# Patient Record
Sex: Male | Born: 1965 | Race: White | Hispanic: No | Marital: Married | Smoking: Never smoker
Health system: Southern US, Community
[De-identification: ages and names within clinical notes are randomized; demographics above are authoritative.]

## PROBLEM LIST (undated history)

## (undated) DIAGNOSIS — F329 Major depressive disorder, single episode, unspecified: Secondary | ICD-10-CM

## (undated) DIAGNOSIS — E7800 Pure hypercholesterolemia, unspecified: Secondary | ICD-10-CM

## (undated) DIAGNOSIS — F32A Depression, unspecified: Secondary | ICD-10-CM

## (undated) DIAGNOSIS — F419 Anxiety disorder, unspecified: Secondary | ICD-10-CM

## (undated) DIAGNOSIS — R51 Headache: Secondary | ICD-10-CM

## (undated) DIAGNOSIS — S4990XA Unspecified injury of shoulder and upper arm, unspecified arm, initial encounter: Secondary | ICD-10-CM

## (undated) HISTORY — DX: Depression, unspecified: F32.A

## (undated) HISTORY — DX: Headache: R51

## (undated) HISTORY — DX: Pure hypercholesterolemia, unspecified: E78.00

## (undated) HISTORY — PX: SHOULDER SURGERY: SHX246

## (undated) HISTORY — PX: OTHER SURGICAL HISTORY: SHX169

## (undated) HISTORY — DX: Major depressive disorder, single episode, unspecified: F32.9

## (undated) HISTORY — DX: Anxiety disorder, unspecified: F41.9

## (undated) HISTORY — DX: Unspecified injury of shoulder and upper arm, unspecified arm, initial encounter: S49.90XA

---

## 2014-03-17 ENCOUNTER — Ambulatory Visit (INDEPENDENT_AMBULATORY_CARE_PROVIDER_SITE_OTHER): Payer: Medicare HMO | Admitting: Psychiatry

## 2014-03-17 ENCOUNTER — Encounter (INDEPENDENT_AMBULATORY_CARE_PROVIDER_SITE_OTHER): Payer: Self-pay

## 2014-03-17 ENCOUNTER — Encounter (HOSPITAL_COMMUNITY): Payer: Self-pay | Admitting: Psychiatry

## 2014-03-17 VITALS — BP 145/90 | HR 90 | Ht 72.0 in | Wt 206.0 lb

## 2014-03-17 DIAGNOSIS — F331 Major depressive disorder, recurrent, moderate: Secondary | ICD-10-CM

## 2014-03-17 MED ORDER — BUPROPION HCL ER (SR) 100 MG PO TB12: ORAL_TABLET | ORAL | Status: DC

## 2014-03-17 NOTE — Progress Notes (Signed)
Patient ID: James Conley, male   DOB: 12-05-1965, 48 y.o.   MRN: [id number]  [facility]   James Conley [id number] 47 y.o.  03/17/2014 10:14 AM  Chief Complaint:  depression  History of Present Illness:   Patient Presents for Initial Evaluation with symptoms of depression. Patient is currently married Caucasian. [place] to [place].  Endorses feeling down and depressed for the last few weeks but says that depression has been there for many years including 15-20 years. His dad died 16 years ago but even before that he was feeling depressed  Associated symptoms are disturbed sleep energy appetite feeling out of focus fatigue difficulty focusing to up at times even at work. Does not endorse hopelessness to the point of suicidal or homicidal thoughts. He feels withdrawn not interested in activities that he used to enjoy before there's nothing specific but states  the relationship may somewhat distant and also financial issues. He feels some guilt related to his work hours that he spends less time with family. Younger daughter also has been giving some difficult time because of her moods.  Modifying factors are medication does help he has been on Lexapro in past that has helped some but he still would feel  Tired.   Duration of depression more than 50-20 years timing possibly related to father's death but there is no specific trigger or context to timing. Does not endorse psychotic symptoms currently or in past. Also no manic symptoms currently or in the past does not endorse any anxiety to the point of having panic attacks  Also the states his job is 40 hours mostly from home but stressful but he still able to function but is feeling somewhat difficult to focus  in this project recently.  Depressive symptoms:  See above   PTSD Symptoms:  Denies any trauma   Past Psychiatric  History/Hospitalization(s) denies  Hospitalization for psychiatric illness: No History of Electroconvulsive Shock Therapy: No Prior Suicide Attempts: No  Medical History; Past Medical History  Diagnosis Date  . Shoulder injury     Allergies: No Known Allergies  Medications: Outpatient Encounter Prescriptions as of 03/17/2014  Medication Sig  . buPROPion (WELLBUTRIN SR) 100 MG 12 hr tablet Take one tablet for first 5 days then start 2 tablets.     Substance Abuse History:   Family History; No family history on file. Mom had anxiety and depression.   Biopsychosocial History:  Grew up with his parents he had a younger brother said that he would get bored in school did finish high school and is currently working as Data processing managerD manager. There's no legal issues history married and has 3 kids 2 with the current wife age 236 and [id number]    Labs:  No results found for this or any previous visit (from the past 2160 hour(s)).     Musculoskeletal: Strength & Muscle Tone: within normal limits Gait & Station: normal Patient leans: N/A  Mental Status Examination;   Psychiatric Specialty Exam: Physical Exam  Constitutional: He appears well-developed and well-nourished.  HENT:  Head: Normocephalic and atraumatic.  Neck: Neck supple.    Review of Systems  Constitutional: Positive for malaise/fatigue. Negative for fever, chills, weight loss and diaphoresis.  HENT: Negative.   Eyes: Negative.   Respiratory: Negative.   Cardiovascular: Negative.   Gastrointestinal: Negative.   Genitourinary: Negative.   Musculoskeletal: Positive for myalgias.  Skin: Negative.   Neurological: Negative.  Negative for weakness.  Endo/Heme/Allergies: Negative.  Psychiatric/Behavioral: Positive for depression. The patient is nervous/anxious.     Blood pressure 145/90, pulse 90, height 6' (1.829 m), weight 206 lb (93.441 kg).Body mass index is 27.93 kg/(m^2).  General Appearance: Casual  Eye Contact::   Fair  Speech:  Normal Rate  Volume:  Decreased  Mood:  Dysphoric  Affect:  Congruent  Thought Process:  Linear  Orientation:  Full (Time, Place, and Person)  Thought Content:  Rumination  Suicidal Thoughts:  No  Homicidal Thoughts:  No  Memory:  Immediate;   Fair Recent;   Fair  Judgement:  Fair  Insight:  Fair  Psychomotor Activity:  Normal  Concentration:  Fair  Recall:  Fair  Akathisia:  Negative  Handed:  Right  AIMS (if indicated):     Assets:  Communication Skills Desire for Improvement Financial Resources/Insurance Housing  Sleep:        Assessment: 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 Shoulder injury     Axis IV: Psychosocial financial   Treatment Plan and Summary: Has been on Lexapro in the past with no reasonable improvement. We'll start Wellbutrin SR 100 mg increase it to 200 mg in the next one week. Informed patient that it may take 2 or 3 weeks to build up in the system. He may need therapy as of now is busy with his work  Pertinent Labs and Relevant Prior Notes reviewed. Medication Side effects, benefits and risks reviewed/discussed with Patient. Time given for patient to respond and asks questions regarding the Diagnosis and Medications. Safety concerns and to report to ER if suicidal or call 911. Relevant Medications refilled or called in to pharmacy. Discussed weight maintenance and Sleep Hygiene. Follow up with Primary care provider in regards to Medical conditions. Recommend compliance with medications and follow up office appointments. Discussed to avail opportunity to consider or/and continue Individual therapy with Counselor. Greater than 50% of time was spend in counseling and coordination of care with the patient.  Schedule for Follow up visit in to 4 weeks  or call in earlier as necessary.   Thresa RossAKHTAR, Chalmers Iddings, MD 03/17/2014

## 2014-04-17 ENCOUNTER — Encounter (INDEPENDENT_AMBULATORY_CARE_PROVIDER_SITE_OTHER): Payer: Self-pay

## 2014-04-17 ENCOUNTER — Ambulatory Visit (INDEPENDENT_AMBULATORY_CARE_PROVIDER_SITE_OTHER): Payer: Medicare HMO | Admitting: Psychiatry

## 2014-04-17 ENCOUNTER — Encounter (HOSPITAL_COMMUNITY): Payer: Self-pay | Admitting: Psychiatry

## 2014-04-17 VITALS — Wt 206.0 lb

## 2014-04-17 DIAGNOSIS — F331 Major depressive disorder, recurrent, moderate: Secondary | ICD-10-CM

## 2014-04-17 MED ORDER — BUPROPION HCL ER (SR) 150 MG PO TB12: ORAL_TABLET | ORAL | Status: DC

## 2014-04-17 NOTE — Progress Notes (Signed)
Patient ID: James Conley, male   DOB: October 21, 1965, 48 y.o.   MRN: [id number]  [facility]  James Conley [id number] 48 y.o.  04/17/2014 2:59 PM  Chief Complaint:  depression  History of Present Illness:   Patient Presents for Initial Evaluation with symptoms of depression. Patient is currently married Caucasian. [place].  Endorses feeling down and depressed for the last few weeks but says that depression has been there for many years including 15-20 years. His dad died 16 years ago but even before that he was feeling depressed Severity of depression. 6/10. 10 being v happy.   last visit we started him on Wellbutrin which is now 200 mg. He's not crying he is having less emotional spells. Is not feeling agitated. But he feels maybe medication is not doing enough. He still works extra hours and feels guilty of not spending too much time at the kids but he is working on it. Discussed the [facility] for therapy.  He has been on Lexapro that helped but he was feeling tired he felt is taking them evening time would have been better.   Duration of depression more than around 15 years or more  years timing possibly related to father's death but there is no specific trigger or context to timing. Does not endorse psychotic symptoms currently or in past. Also no manic symptoms currently or in the past does not endorse any anxiety to the point of having panic attacks  Also the states his job is 40 hours mostly from home but stressful but he still able to function but is feeling somewhat difficult to focus  in this project recently.  No other was reported side effects on Wellbutrin. He has been taking one in the morning and one at night. Informed not to take him that nighttime and we will probably increase the dose of the medication.    Past Psychiatric History/Hospitalization(s) denies  Hospitalization for psychiatric illness: No History  of Electroconvulsive Shock Therapy: No Prior Suicide Attempts: No  Medical History; Past Medical History  Diagnosis Date  . Shoulder injury   . Anxiety   . Depression   . Headache(784.0)     Allergies: No Known Allergies  Medications: Outpatient Encounter Prescriptions as of 04/17/2014  Medication Sig  . buPROPion (WELLBUTRIN SR) 150 MG 12 hr tablet Take  2 tablets.  . [DISCONTINUED] buPROPion (WELLBUTRIN SR) 100 MG 12 hr tablet Take one tablet for first 5 days then start 2 tablets.     Substance Abuse History:   Family History; Family History  Problem Relation Age of Onset  . Anxiety disorder Mother    Mom had anxiety and depression.   Biopsychosocial History:  Grew up with his parents he had a younger brother said that he would get bored in school did finish high school and is currently working as Data processing managerD manager. There's no legal issues history married and has 3 kids 2 with the current wife age 696 and 110    Labs:  No results found for this or any previous visit (from the past 2160 hour(s)).     Musculoskeletal: Strength & Muscle Tone: within normal limits Gait & Station: normal Patient leans: N/A  Mental Status Examination;   Psychiatric Specialty Exam: Physical Exam  Constitutional: He appears well-developed and well-nourished.  HENT:  Head: Normocephalic and atraumatic.  Neck: Neck supple.    Review of Systems  Constitutional: Positive for malaise/fatigue. Negative for fever, chills, weight  loss and diaphoresis.  HENT: Negative.   Eyes: Negative.   Respiratory: Negative.   Cardiovascular: Negative.   Gastrointestinal: Negative.   Genitourinary: Negative.   Musculoskeletal: Positive for myalgias.  Skin: Negative.   Neurological: Negative.  Negative for weakness.  Endo/Heme/Allergies: Negative.   Psychiatric/Behavioral: Positive for depression. The patient is nervous/anxious.     Weight 206 lb (93.441 kg).Body mass index is 27.93 kg/(m^2).   General Appearance: Casual  Eye Contact::  Fair  Speech:  Normal Rate  Volume:  Decreased  Mood:  Dysphoric  Affect:  Congruent  Thought Process:  Linear  Orientation:  Full (Time, Place, and Person)  Thought Content:  Rumination  Suicidal Thoughts:  No  Homicidal Thoughts:  No  Memory:  Immediate;   Fair Recent;   Fair  Judgement:  Fair  Insight:  Fair  Psychomotor Activity:  Normal  Concentration:  Fair  Recall:  Fair  Akathisia:  Negative  Handed:  Right  AIMS (if indicated):     Assets:  Communication Skills Desire for Improvement Financial Resources/Insurance Housing  Sleep:        Assessment: 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 Some improved.  Axis II: Deferred  Axis III:  Past Medical History  Diagnosis Date  . Shoulder injury   . Anxiety   . Depression   . Headache(784.0)     Axis IV: Psychosocial financial   Treatment Plan and Summary: Increase Wellbutrin to 150 mg 2 tablets a day. Labs are pending for thyroid status.  His triglycerides are high his primary care physician started him on high cholesterol medication. If he still has concerns of Wellbutrin and we can consider Lexapro. There has been improvement in depression on Wellbutrin Discussed weight maintenance and Sleep Hygiene. Follow up with Primary care provider in regards to Medical conditions. Recommend compliance with medications and follow up office appointments. Discussed to avail opportunity to consider or/and continue Individual therapy with Counselor. Greater than 50% of time was spend in counseling and coordination of care with the patient.  Schedule for Follow up visit in to 4 weeks  or call in earlier as necessary.   Thresa Ross, MD 04/17/2014

## 2014-05-25 ENCOUNTER — Ambulatory Visit (INDEPENDENT_AMBULATORY_CARE_PROVIDER_SITE_OTHER): Payer: Medicare HMO | Admitting: Psychiatry

## 2014-05-25 ENCOUNTER — Encounter (INDEPENDENT_AMBULATORY_CARE_PROVIDER_SITE_OTHER): Payer: Self-pay

## 2014-05-25 DIAGNOSIS — F331 Major depressive disorder, recurrent, moderate: Secondary | ICD-10-CM

## 2014-05-25 MED ORDER — ESCITALOPRAM OXALATE 10 MG PO TABS: 10.0000 mg | ORAL_TABLET | Freq: Every day | ORAL | Status: DC

## 2014-05-25 NOTE — Progress Notes (Signed)
Patient ID: James Conley Mummert, male   DOB: 02/19/1966, 48 y.o.   MRN: [id number]  [facility]  James Conley Canoy [id number] 48 y.o.  05/25/2014 9:22 AM  Chief Complaint:  depression  History of Present Illness:   Patient Presents for Initial Evaluation with symptoms of depression. Patient is currently married Caucasian. [place].  Endorses feeling down and depressed for the last few weeks but says that depression has been there for many years including 15-20 years. His dad died 16 years ago but even before that he was feeling depressed Severity of depression. 6/10. 10 being v happy.  He feels the medication Wellbutrin has been helpful but feels agitated gets mad easily. Having difficulty in  relationship at home and also the kids does not listen to him too much. He is doing therapy that is helpful but is not satisfied with the medication considering the side effect or making him agitated  He has been on Lexapro that helped but he was feeling tired he felt is taking them evening time would have been better.   Duration of depression more than around 15 years or more  years timing possibly related to father's death but there is no specific trigger or context to timing. Does not endorse psychotic symptoms currently or in past. Also no manic symptoms currently or in the past does not endorse any anxiety to the point of having panic attacks  Also the states his job is 40 hours mostly from home but stressful but he still able to function but is feeling somewhat difficult to focus  in this project recently.    Past Psychiatric History/Hospitalization(s) denies  Hospitalization for psychiatric illness: No History of Electroconvulsive Shock Therapy: No Prior Suicide Attempts: No  Medical History; Past Medical History  Diagnosis Date  . Shoulder injury   . Anxiety   . Depression   . Headache(784.0)     Allergies: No Known  Allergies  Medications: Outpatient Encounter Prescriptions as of 05/25/2014  Medication Sig  . escitalopram (LEXAPRO) 10 MG tablet Take 1 tablet (10 mg total) by mouth daily.  . [DISCONTINUED] buPROPion (WELLBUTRIN SR) 150 MG 12 hr tablet Take  2 tablets.     Substance Abuse History:   Family History; Family History  Problem Relation Age of Onset  . Anxiety disorder Mother    Mom had anxiety and depression.   Biopsychosocial History:  Grew up with his parents he had a younger brother said that he would get bored in school did finish high school and is currently working as Data processing managerD manager. There's no legal issues history married and has 3 kids 2 with the current wife age 396 and 3710    Labs:  No results found for this or any previous visit (from the past 2160 hour(s)).     Musculoskeletal: Strength & Muscle Tone: within normal limits Gait & Station: normal Patient leans: N/A  Mental Status Examination;   Psychiatric Specialty Exam: Physical Exam  Constitutional: He appears well-developed and well-nourished.  HENT:  Head: Normocephalic and atraumatic.  Neck: Neck supple.    Review of Systems  Constitutional: Positive for malaise/fatigue. Negative for fever, chills, weight loss and diaphoresis.  HENT: Negative.   Eyes: Negative.   Respiratory: Negative.   Cardiovascular: Negative.   Gastrointestinal: Negative.   Genitourinary: Negative.   Musculoskeletal: Negative for myalgias.  Skin: Negative.   Neurological: Negative.  Negative for weakness.  Endo/Heme/Allergies: Negative.   Psychiatric/Behavioral: Positive for depression.  Negative for suicidal ideas and substance abuse. The patient is nervous/anxious.     There were no vitals taken for this visit.There is no weight on file to calculate BMI.  General Appearance: Casual  Eye Contact::  Fair  Speech:  Normal Rate  Volume:  Decreased  Mood:  Dysphoric  Affect:  Congruent  Thought Process:  Linear  Orientation:   Full (Time, Place, and Person)  Thought Content:  Rumination  Suicidal Thoughts:  No  Homicidal Thoughts:  No  Memory:  Immediate;   Fair Recent;   Fair  Judgement:  Fair  Insight:  Fair  Psychomotor Activity:  Normal  Concentration:  Fair  Recall:  Fair  Akathisia:  Negative  Handed:  Right  AIMS (if indicated):     Assets:  Communication Skills Desire for Improvement Financial Resources/Insurance Housing  Sleep:        Assessment: 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 Some improved.  Axis II: Deferred  Axis III:  Past Medical History  Diagnosis Date  . Shoulder injury   . Anxiety   . Depression   . Headache(784.0)     Axis IV: Psychosocial financial   Treatment Plan and Summary: Discontinue Wellbutrin. Restart Lexapro dose of 10 mg and can gradually increasing 20 mg pills him back in 4 weeks. Continue therapy as psychosocial issues are affecting the dynamics of family.  Discussed weight maintenance and Sleep Hygiene. Follow up with Primary care provider in regards to Medical conditions. Recommend compliance with medications and follow up office appointments. Discussed to avail opportunity to consider or/and continue Individual therapy with Counselor. Greater than 50% of time was spend in counseling and coordination of care with the patient.  Schedule for Follow up visit in to 4 weeks  or call in earlier as necessary.   Thresa Ross, MD 05/25/2014

## 2014-06-26 ENCOUNTER — Ambulatory Visit (HOSPITAL_COMMUNITY): Payer: Medicare HMO | Admitting: Psychiatry

## 2014-07-06 ENCOUNTER — Ambulatory Visit (INDEPENDENT_AMBULATORY_CARE_PROVIDER_SITE_OTHER): Payer: Medicare HMO | Admitting: Psychiatry

## 2014-07-06 ENCOUNTER — Encounter (HOSPITAL_COMMUNITY): Payer: Self-pay | Admitting: Psychiatry

## 2014-07-06 VITALS — BP 150/90 | HR 90 | Wt 205.0 lb

## 2014-07-06 DIAGNOSIS — F331 Major depressive disorder, recurrent, moderate: Secondary | ICD-10-CM

## 2014-07-06 MED ORDER — ESCITALOPRAM OXALATE 10 MG PO TABS: 15.0000 mg | ORAL_TABLET | Freq: Every day | ORAL | Status: DC

## 2014-07-06 NOTE — Progress Notes (Signed)
Patient ID: James SacramentoBrian Conley, male   DOB: 1965/12/14, 48 y.o.   MRN: [id number] [facility]  James Conley [id number] 48 y.o.  07/06/2014 9:38 AM  Chief Complaint:  depression  History of Present Illness:   Patient Presents for Initial Evaluation with symptoms of depression. Patient is currently married Caucasian. [place].  Endorses feeling down and depressed for the last few weeks but says that depression has been there for many years including 15-20 years. His dad died 16 years ago but even before that he was feeling depressed Severity of depression now 7/10. 10 being v happy.  We changed wellbutrin to lexapro last visit because of concern of agitation with Wellbutrin. He is responding to Lexapro. Feels more: Although somewhat tired. He is to follow up with his primary care physician regarding his high blood pressure that may be contributing to his tiredness also   Duration of depression more than around 15 years or more  years timing possibly related to father's death but there is no specific trigger or context to timing. Does not endorse psychotic symptoms currently or in past. Also no manic symptoms currently or in the past does not endorse any anxiety to the point of having panic attacks  Also the states his job is 40 hours mostly from home but stressful but he still able to function but is feeling somewhat difficult to focus  in this project recently.    Past Psychiatric History/Hospitalization(s) denies  Hospitalization for psychiatric illness: No History of Electroconvulsive Shock Therapy: No Prior Suicide Attempts: No  Medical History; Past Medical History  Diagnosis Date  . Shoulder injury   . Anxiety   . Depression   . Headache(784.0)     Allergies: No Known Allergies  Medications: Outpatient Encounter Prescriptions as of 07/06/2014  Medication Sig  . escitalopram (LEXAPRO) 10 MG tablet Take 1.5  tablets (15 mg total) by mouth daily.  . [DISCONTINUED] escitalopram (LEXAPRO) 10 MG tablet Take 1 tablet (10 mg total) by mouth daily.     Substance Abuse History:   Family History; Family History  Problem Relation Age of Onset  . Anxiety disorder Mother    Mom had anxiety and depression.   Biopsychosocial History:  Grew up with his parents he had a younger brother said that he would get bored in school did finish high school and is currently working as Data processing managerD manager. There's no legal issues history married and has 3 kids 2 with the current wife age 126 and [id number]    Labs:  No results found for this or any previous visit (from the past 2160 hour(s)).     Musculoskeletal: Strength & Muscle Tone: within normal limits Gait & Station: normal Patient leans: N/A  Mental Status Examination;   Psychiatric Specialty Exam: Physical Exam  Constitutional: He appears well-developed and well-nourished.  HENT:  Head: Normocephalic and atraumatic.  Neck: Neck supple.    Review of Systems  Constitutional: Positive for malaise/fatigue. Negative for fever, chills, weight loss and diaphoresis.  HENT: Negative.   Eyes: Negative.   Respiratory: Negative.   Cardiovascular: Negative.   Gastrointestinal: Negative.   Genitourinary: Negative.   Musculoskeletal: Negative for myalgias.  Skin: Negative.   Neurological: Negative.  Negative for weakness.  Endo/Heme/Allergies: Negative.   Psychiatric/Behavioral: Positive for depression. Negative for suicidal ideas and substance abuse. The patient is nervous/anxious.     Blood pressure 150/90, pulse 90, weight 205 lb (92.987 kg).Body mass index is 27.8  kg/(m^2).  General Appearance: Casual  Eye Contact::  Fair  Speech:  Normal Rate  Volume:  Decreased  Mood:  Dysphoric  Affect:  Congruent  Thought Process:  Linear  Orientation:  Full (Time, Place, and Person)  Thought Content:  Rumination  Suicidal Thoughts:  No  Homicidal Thoughts:  No   Memory:  Immediate;   Fair Recent;   Fair  Judgement:  Fair  Insight:  Fair  Psychomotor Activity:  Normal  Concentration:  Fair  Recall:  Fair  Akathisia:  Negative  Handed:  Right  AIMS (if indicated):     Assets:  Communication Skills Desire for Improvement Financial Resources/Insurance Housing  Sleep:        Assessment: 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 Some improved.  Axis II: Deferred  Axis III:  Past Medical History  Diagnosis Date  . Shoulder injury   . Anxiety   . Depression   . Headache(784.0)     Axis IV: Psychosocial financial   Treatment Plan and Summary: Responding to Lexapro. Increase dose to 15 mg. I will write down 30 day supply but his concern is financial issues and he wants to a 90 day supply and he understands the risk. He will call back if he wants to change it to a 90 day prescription. Discussed weight maintenance and Sleep Hygiene. Follow up with Primary care provider in regards to Medical conditions. Recommend compliance with medications and follow up office appointments. Discussed to avail opportunity to consider or/and continue Individual therapy with Counselor. Greater than 50% of time was spend in counseling and coordination of care with the patient.  Schedule for Follow up visit in to 4 weeks  or call in earlier as necessary.   Thresa Ross, MD 07/06/2014

## 2014-07-21 ENCOUNTER — Telehealth (HOSPITAL_COMMUNITY): Payer: Self-pay

## 2014-07-21 MED ORDER — ESCITALOPRAM OXALATE 10 MG PO TABS: 15.0000 mg | ORAL_TABLET | Freq: Every day | ORAL | Status: DC

## 2014-07-21 NOTE — Telephone Encounter (Signed)
Pt needs a 3 month supply written for his medication. He states he spoke to you about this previously.

## 2014-07-21 NOTE — Telephone Encounter (Signed)
90 day refill sent to pharmacy at patient's request. If suicidal or depressed avoid keeping all medications at one place and ask someone to monitor. Also call for appointment

## 2014-09-05 ENCOUNTER — Encounter (HOSPITAL_COMMUNITY): Payer: Self-pay | Admitting: Psychiatry

## 2014-09-05 ENCOUNTER — Ambulatory Visit (INDEPENDENT_AMBULATORY_CARE_PROVIDER_SITE_OTHER): Payer: Medicare HMO | Admitting: Psychiatry

## 2014-09-05 ENCOUNTER — Encounter (INDEPENDENT_AMBULATORY_CARE_PROVIDER_SITE_OTHER): Payer: Self-pay

## 2014-09-05 VITALS — BP 136/82 | HR 98 | Ht 72.0 in | Wt 218.0 lb

## 2014-09-05 DIAGNOSIS — F331 Major depressive disorder, recurrent, moderate: Secondary | ICD-10-CM

## 2014-09-05 NOTE — Progress Notes (Signed)
Patient ID: James Conley, male   DOB: August 25, 1966, 48 y.o.   MRN: [id number] [facility] Follow up  James Conley [id number] 48 y.o.  09/05/2014 9:39 AM  Chief Complaint:  depression  History of Present Illness:   Patient Presents for follow up visit for medication management and diagnosis of depression. Patient is currently married Caucasian. Recently [place] from [place] to [place].  Iinitially endorsed depression for the last few weeks but says that depression has been there for many years including 15-20 years. His dad died 16 years ago but even before that he was feeling depressed Severity of depression now 7/10. 10 being v happy. Other triggers her depression 200 AT home arguments with the kids and wife. Last visit we increased the Lexapro because of depression. He did not do good on Wellbutrin was feeling agitated. Lexapro has been helpful things are better and more balanced and his mood wise. Relationship and work is also going on better he works in [employer].   Duration of depression more than around 15 years or more  years timing possibly related to father's death but there is no specific trigger or context to timing. Does not endorse psychotic symptoms currently or in past. Also no manic symptoms currently or in the past does not endorse any anxiety to the point of having panic attacks  Also the states his job is 40 hours mostly from home but stressful but he still able to function but is feeling somewhat difficult to focus  in this project recently.    Past Psychiatric History/Hospitalization(s) denies  Hospitalization for psychiatric illness: No History of Electroconvulsive Shock Therapy: No Prior Suicide Attempts: No  Medical History; Past Medical History  Diagnosis Date  . Shoulder injury   . Anxiety   . Depression   . Headache(784.0)     Allergies: No Known Allergies  Medications: Outpatient Encounter Prescriptions as of 09/05/2014  Medication  Sig  . escitalopram (LEXAPRO) 10 MG tablet Take 1.5 tablets (15 mg total) by mouth daily.  . fenofibrate 160 MG tablet Take 160 mg by mouth daily.     Substance Abuse History:   Family History; Family History  Problem Relation Age of Onset  . Anxiety disorder Mother    Mom had anxiety and depression.      Labs:  No results found for this or any previous visit (from the past 2160 hour(s)).     Musculoskeletal: Strength & Muscle Tone: within normal limits Gait & Station: normal Patient leans: N/A  Mental Status Examination;   Psychiatric Specialty Exam: Physical Exam  Constitutional: He appears well-developed and well-nourished.  HENT:  Head: Normocephalic and atraumatic.  Neck: Neck supple.    Review of Systems  Constitutional: Positive for malaise/fatigue. Negative for fever, chills, weight loss and diaphoresis.  Cardiovascular: Negative for chest pain.  Gastrointestinal: Negative for nausea.  Musculoskeletal: Negative for myalgias.  Neurological: Negative.  Negative for tremors and weakness.  Endo/Heme/Allergies: Negative.   Psychiatric/Behavioral: Negative for depression, suicidal ideas and substance abuse.    Blood pressure 136/82, pulse 98, height 6' (1.829 m), weight 218 lb (98.884 kg).Body mass index is 29.56 kg/(m^2).  General Appearance: Casual  Eye Contact::  Fair  Speech:  Normal Rate  Volume:  Decreased  Mood:  Dysphoric  Affect:  Congruent  Thought Process:  Linear  Orientation:  Full (Time, Place, and Person)  Thought Content:  Rumination  Suicidal Thoughts:  No  Homicidal Thoughts:  No  Memory:  Immediate;   Fair  Recent;   Fair  Judgement:  Fair  Insight:  Fair  Psychomotor Activity:  Normal  Concentration:  Fair  Recall:  Fair  Akathisia:  Negative  Handed:  Right  AIMS (if indicated):     Assets:  Communication Skills Desire for Improvement Financial Resources/Insurance Housing  Sleep:        Assessment: Axis I: Maj.  depressive disorder recurrent moderate. improving  Axis II: Deferred  Axis III:  Past Medical History  Diagnosis Date  . Shoulder injury   . Anxiety   . Depression   . Headache(784.0)     Axis IV: Psychosocial financial   Treatment Plan and Summary: Responding to Lexapro. He is on 15mg . There is some concern about tiredness discussed about possible cutting on the dose but considering it is Christmas time and We discussed and will continue the same dose of 15 mg Will consider lowering it down next visit. He has medications left over. He will call in a month to get his 90 day supply. Loss of weight loss he said he is more active and bisecting but still Discussed weight maintenance and Sleep Hygiene. Follow up with Primary care provider in regards to Medical conditions. Recommend compliance with medications and follow up office appointments. Discussed to avail opportunity to consider or/and continue Individual therapy with Counselor. Greater than 50% of time was spend in counseling and coordination of care with the patient.  Schedule for Follow up visit in to  weeks  or call in earlier as necessary.   Thresa RossAKHTAR, Debroah Shuttleworth, MD 09/05/2014

## 2014-10-23 ENCOUNTER — Other Ambulatory Visit (HOSPITAL_COMMUNITY): Payer: Self-pay | Admitting: *Deleted

## 2014-10-23 DIAGNOSIS — F331 Major depressive disorder, recurrent, moderate: Secondary | ICD-10-CM

## 2014-10-23 MED ORDER — ESCITALOPRAM OXALATE 10 MG PO TABS: 15.0000 mg | ORAL_TABLET | Freq: Every day | ORAL | Status: DC

## 2014-10-23 NOTE — Telephone Encounter (Signed)
Per Dr. Gilmore LarocheAkhtar, pt is authorized for 90 day supply of Lexapro, Qty 135. Pt has appt 3/1. Pt states and shows understanding.

## 2014-12-05 ENCOUNTER — Ambulatory Visit (INDEPENDENT_AMBULATORY_CARE_PROVIDER_SITE_OTHER): Payer: Medicare HMO | Admitting: Psychiatry

## 2014-12-05 ENCOUNTER — Encounter (HOSPITAL_COMMUNITY): Payer: Self-pay | Admitting: Psychiatry

## 2014-12-05 ENCOUNTER — Encounter (INDEPENDENT_AMBULATORY_CARE_PROVIDER_SITE_OTHER): Payer: Self-pay

## 2014-12-05 VITALS — BP 146/90 | HR 91 | Ht 72.0 in | Wt 218.0 lb

## 2014-12-05 DIAGNOSIS — F331 Major depressive disorder, recurrent, moderate: Secondary | ICD-10-CM

## 2014-12-05 NOTE — Progress Notes (Signed)
Patient ID: James Conley, male   DOB: 1966-09-01, 49 y.o.   MRN: [id number] [facility] Follow [facility] Outpatient visit  James Conley [id number] 48 y.o.  12/05/2014 9:52 AM  Chief Complaint:  depression  History of Present Illness:   Patient Presents for follow up visit for medication management and diagnosis of depression. Patient is currently married Caucasian. [place] .  Iinitially endorsed depression for the last few weeks but says that depression has been there for many years including 15-20 years. His dad died 16 years ago but even before that he was feeling depressed Severity of depression now 7/10. 10 being v happy. Other triggers for depression are conflicts at home and in relationship. He thinks he is ready to lower lexapro since its spring.  Relationship and work is also going on better he works in [employer].   Duration of depression more than around 15 years or more  years timing possibly related to father's death but there is no specific trigger or context to timing. Does not endorse psychotic symptoms currently or in past. Also no manic symptoms currently or in the past does not endorse any anxiety to the point of having panic attacks  Also the states his job is 40 hours mostly from home but stressful but he still able to function but is feeling somewhat difficult to focus  in this project recently.    Past Psychiatric History/Hospitalization(s) denies  Hospitalization for psychiatric illness: No History of Electroconvulsive Shock Therapy: No Prior Suicide Attempts: No  Medical History; Past Medical History  Diagnosis Date  . Shoulder injury   . Anxiety   . Depression   . Headache(784.0)     Allergies: No Known Allergies  Medications: Outpatient Encounter Prescriptions as of 12/05/2014  Medication Sig  . escitalopram (LEXAPRO) 10 MG tablet Take 1.5 tablets (15 mg total) by mouth daily.  . fenofibrate 160 MG tablet  Take 160 mg by mouth daily.     Substance Abuse History:   Family History; Family History  Problem Relation Age of Onset  . Anxiety disorder Mother    Mom had anxiety and depression.      Labs:  No results found for this or any previous visit (from the past 2160 hour(s)).     Musculoskeletal: Strength & Muscle Tone: within normal limits Gait & Station: normal Patient leans: N/A  Mental Status Examination;   Psychiatric Specialty Exam: Physical Exam  Constitutional: He appears well-developed and well-nourished.  HENT:  Head: Normocephalic and atraumatic.  Neck: Neck supple.    Review of Systems  Constitutional: Negative.   Cardiovascular: Negative for chest pain.  Gastrointestinal: Negative for nausea.  Skin: Negative for rash.  Neurological: Negative for tremors.    Blood pressure 146/90, pulse 91, height 6' (1.829 m), weight 218 lb (98.884 kg).Body mass index is 29.56 kg/(m^2).  General Appearance: Casual  Eye Contact::  Fair  Speech:  Normal Rate  Volume:  Decreased  Mood:  Dysphoric  Affect:  Congruent  Thought Process:  Linear  Orientation:  Full (Time, Place, and Person)  Thought Content:  Rumination  Suicidal Thoughts:  No  Homicidal Thoughts:  No  Memory:  Immediate;   Fair Recent;   Fair  Judgement:  Fair  Insight:  Fair  Psychomotor Activity:  Normal  Concentration:  Fair  Recall:  Fair  Akathisia:  Negative  Handed:  Right  AIMS (if indicated):     Assets:  Communication Skills Desire for Improvement  Financial Resources/Insurance Housing  Sleep:        Assessment: 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   Axis II: Deferred  Axis III:  Past Medical History  Diagnosis Date  . Shoulder injury   . Anxiety   . Depression   . Headache(784.0)     Axis IV: Psychosocial financial   Treatment Plan and Summary: Lower Lexapro from 15-10 mg he did not see much benefit from 15 mg. He says that he would do okay on  the 10 mg also. In regarding to psychosocial issues and marital conflicts he feels that he may make an appointment but overall does not feel that there is some urgency. Discussed weight maintenance and Sleep Hygiene. Follow up with Primary care provider in regards to Medical conditions. Recommend compliance with medications and follow up office appointments. Discussed to avail opportunity to consider or/and continue Individual therapy with Counselor. Greater than 50% of time was spend in counseling and coordination of care with the patient.  Schedule for Follow up visit in 8   weeks  or call in earlier as necessary.   Thresa Ross, MD 12/05/2014

## 2015-02-06 ENCOUNTER — Ambulatory Visit (INDEPENDENT_AMBULATORY_CARE_PROVIDER_SITE_OTHER): Payer: Medicare HMO | Admitting: Psychiatry

## 2015-02-06 ENCOUNTER — Encounter (HOSPITAL_COMMUNITY): Payer: Self-pay | Admitting: Psychiatry

## 2015-02-06 VITALS — BP 120/80 | HR 95 | Ht 72.0 in | Wt 216.0 lb

## 2015-02-06 DIAGNOSIS — F331 Major depressive disorder, recurrent, moderate: Secondary | ICD-10-CM

## 2015-02-06 DIAGNOSIS — R5383 Other fatigue: Secondary | ICD-10-CM

## 2015-02-06 DIAGNOSIS — G4700 Insomnia, unspecified: Secondary | ICD-10-CM

## 2015-02-06 MED ORDER — ESCITALOPRAM OXALATE 10 MG PO TABS: 10.0000 mg | ORAL_TABLET | Freq: Every day | ORAL | Status: DC

## 2015-02-06 MED ORDER — TRAZODONE HCL 50 MG PO TABS: 50.0000 mg | ORAL_TABLET | Freq: Every evening | ORAL | Status: DC | PRN

## 2015-02-06 NOTE — Progress Notes (Signed)
Patient ID: James Conley, male   DOB: 1965/12/26, 49 y.o.   MRN: [id number] [facility] Follow up Outpatient visit  James Conley [id number] 48 y.o.  02/06/2015 10:08 AM  Chief Complaint:  depression  History of Present Illness:   Patient Presents for follow up visit for medication management and diagnosis of depression, insomnia and fatigue. Patient is currently married Caucasian. Recently [place] to [place].  Iinitially endorsed depression for the last few weeks but says that depression has been there for many years including 15-20 years. His dad died 16 years ago but even before that he was feeling depressed Severity of depression now 8/10. 10 being v happy. Depression improved. Tolerating lower lexapro dose . Still has problems with sleep at night. Also endorses fatigue and tiredness during the day.  Medical complexity: has Tsh done, says its normal will bring report to rule out any medical condition causing tiredness. Other triggers for depression are conflicts at home and in relationship.   Relationship and work is also going on better he works in [employer].   Duration of depression more than around 15 years or more  years timing possibly related to father's death but there is no specific trigger or context to timing. Does not endorse psychotic symptoms currently or in past. Also no manic symptoms currently or in the past does not endorse any anxiety to the point of having panic attacks  Also the states his job is 40 hours mostly from home but stressful but he still able to function but is feeling somewhat difficult to focus  in this project recently.    Past Psychiatric History/Hospitalization(s) denies  Hospitalization for psychiatric illness: No History of Electroconvulsive Shock Therapy: No Prior Suicide Attempts: No  Medical History; Past Medical History  Diagnosis Date  . Shoulder injury   . Anxiety   . Depression   . Headache(784.0)      Allergies: No Known Allergies  Medications: Outpatient Encounter Prescriptions as of 02/06/2015  . Order #: [id number] Class: Historical Med  . Order #: [id number] Class: Normal  . Order #: [id number] Class: Historical Med  . [DISCONTINUED] Order #: [id number] Class: Normal  . Order #: [id number] Class: Normal     Substance Abuse History:   Family History; Family History  Problem Relation Age of Onset  . Anxiety disorder Mother    Mom had anxiety and depression.      Labs:  No results found for this or any previous visit (from the past 2160 hour(s)).     Musculoskeletal: Strength & Muscle Tone: within normal limits Gait & Station: normal Patient leans: N/A  Mental Status Examination;   Psychiatric Specialty Exam: Physical Exam  Constitutional: He appears well-developed and well-nourished.  HENT:  Head: Normocephalic and atraumatic.  Neck: Neck supple.    Review of Systems  Constitutional: Positive for malaise/fatigue.  Gastrointestinal: Negative for nausea.  Neurological: Negative for tremors.  Psychiatric/Behavioral: The patient has insomnia.     Blood pressure 120/80, pulse 95, height 6' (1.829 m), weight 216 lb (97.977 kg), SpO2 96 %.Body mass index is 29.29 kg/(m^2).  General Appearance: Casual  Eye Contact::  Fair  Speech:  Normal Rate  Volume:  Decreased  Mood:  Less dysphoric  Affect:  Congruent  Thought Process:  Linear  Orientation:  Full (Time, Place, and Person)  Thought Content:  Rumination  Suicidal Thoughts:  No  Homicidal Thoughts:  No  Memory:  Immediate;   Fair Recent;   Fair  Judgement:  Fair  Insight:  Fair  Psychomotor Activity:  Normal  Concentration:  Fair  Recall:  Fair  Akathisia:  Negative  Handed:  Right  AIMS (if indicated):     Assets:  Communication Skills Desire for Improvement Financial Resources/Insurance Housing  Sleep:        Assessment: 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 Insomnia.  Fatigue other  Axis II: Deferred  Axis III:  Past Medical History  Diagnosis Date  . Shoulder injury   . Anxiety   . Depression   . Headache(784.0)     Axis IV: Psychosocial financial   Treatment Plan and Summary: Lexapro 10 mg prescription written for 90 days.  Add trazodone 50 mg for sleep. He will bring his lab work to review TSH or any other causes for tiredness.    Discussed weight maintenance and Sleep Hygiene in regard to his insomnia and how to work on his difficulty sleeping Follow up with Primary care provider in regards to Medical conditions. Recommend compliance with medications and follow up office appointments. Discussed to avail opportunity to consider or/and continue Individual therapy with Counselor. Greater than 50% of time was spend in counseling and coordination of care with the patient.  Schedule for Follow up visit in 8   weeks  or call in earlier as necessary.  Time spent: 25 minutes  Thresa Ross, MD 02/06/2015

## 2015-04-10 ENCOUNTER — Ambulatory Visit (HOSPITAL_COMMUNITY): Payer: Self-pay | Admitting: Psychiatry

## 2015-04-24 ENCOUNTER — Ambulatory Visit (HOSPITAL_COMMUNITY): Payer: Self-pay | Admitting: Psychiatry

## 2015-05-18 ENCOUNTER — Ambulatory Visit (INDEPENDENT_AMBULATORY_CARE_PROVIDER_SITE_OTHER): Payer: Medicare HMO | Admitting: Psychiatry

## 2015-05-18 ENCOUNTER — Encounter (HOSPITAL_COMMUNITY): Payer: Self-pay | Admitting: Psychiatry

## 2015-05-18 VITALS — Ht 72.0 in | Wt 216.0 lb

## 2015-05-18 DIAGNOSIS — R5383 Other fatigue: Secondary | ICD-10-CM | POA: Diagnosis not present

## 2015-05-18 DIAGNOSIS — F331 Major depressive disorder, recurrent, moderate: Secondary | ICD-10-CM

## 2015-05-18 DIAGNOSIS — G4700 Insomnia, unspecified: Secondary | ICD-10-CM

## 2015-05-18 MED ORDER — ESCITALOPRAM OXALATE 10 MG PO TABS: 10.0000 mg | ORAL_TABLET | Freq: Every day | ORAL | Status: DC

## 2015-05-18 NOTE — Progress Notes (Signed)
Patient ID: James Conley, male   DOB: [birth year], 49 y.o.   MRN: [id number] [facility] Follow up Outpatient visit  James Conley [id number] 48 y.o.  05/18/2015 9:57 AM  Chief Complaint:  depression  History of Present Illness:   Patient Presents for follow up visit for medication management and diagnosis of depression, insomnia and fatigue. Patient is currently married Caucasian. Recently moved from [place] to [place].  Iinitially endorsed depression for the last few weeks but says that depression has been there for many years including 15-20 years. His dad died 16 years ago but even before that he was feeling depressed Last visit we added trazodone 50 mg for insomnia. He does sleep better on trazodone sometime he does not take it. His fatigue is also improved somewhat since he states better. Says that since trazodone knocks him out he does not want to take it every day he talked about cutting down the dose to half a tablet if needed.  Severity of depression now 8/10. 10 being v happy. Depression improved. Tolerating lower lexapro dose . Still has problems with sleep at night. Also endorses fatigue and tiredness during the day.  Medical complexity: has Tsh done, says its normal will bring report to rule out any medical condition causing tiredness. Other triggers for depression are conflicts at home and in relationship.   Relationship and work is also going on better he works in [employer]ing [employer].   Duration of depression more than around 15 years or more  years timing possibly related to father's death but there is no specific trigger or context to timing. Does not endorse psychotic symptoms currently or in past. Also no manic symptoms currently or in the past does not endorse any anxiety to the point of having panic attacks  Also the states his job is 40 hours mostly from home but stressful but he still able to function but is feeling somewhat difficult to focus  in this project  recently.    Past Psychiatric History/Hospitalization(s) denies  Hospitalization for psychiatric illness: No History of Electroconvulsive Shock Therapy: No Prior Suicide Attempts: No  Medical History; Past Medical History  Diagnosis Date  . Shoulder injury   . Anxiety   . Depression   . Headache(784.0)     Allergies: No Known Allergies  Medications: Outpatient Encounter Prescriptions as of 05/18/2015  Medication Sig  . amLODipine (NORVASC) 2.5 MG tablet   . escitalopram (LEXAPRO) 10 MG tablet Take 1 tablet (10 mg total) by mouth daily.  . fenofibrate 160 MG tablet Take 160 mg by mouth daily.  . traZODone (DESYREL) 50 MG tablet Take 1 tablet (50 mg total) by mouth at bedtime as needed for sleep.  . [DISCONTINUED] escitalopram (LEXAPRO) 10 MG tablet Take 1 tablet (10 mg total) by mouth daily.   No facility-administered encounter medications on file as of 05/18/2015.     Substance Abuse History:   Family History; Family History  Problem Relation Age of Onset  . Anxiety disorder Mother    Mom had anxiety and depression.      Labs:  No results found for this or any previous visit (from the past 2160 hour(s)).     Musculoskeletal: Strength & Muscle Tone: within normal limits Gait & Station: normal Patient leans: N/A  Mental Status Examination;   Psychiatric Specialty Exam: Physical Exam  Constitutional: He appears well-developed and well-nourished.  HENT:  Head: Normocephalic and atraumatic.  Neck: Neck supple.    Review of Systems  Cardiovascular: Negative  for chest pain.  Gastrointestinal: Negative for nausea.  Neurological: Negative for tremors.  Psychiatric/Behavioral: The patient has insomnia.     Height 6' (1.829 m), weight 216 lb (97.977 kg).Body mass index is 29.29 kg/(m^2).  General Appearance: Casual  Eye Contact::  Fair  Speech:  Normal Rate  Volume:  Decreased  Mood:  Less dysphoric  Affect:  Congruent  Thought Process:  Linear   Orientation:  Full (Time, Place, and Person)  Thought Content:  Rumination  Suicidal Thoughts:  No  Homicidal Thoughts:  No  Memory:  Immediate;   Fair Recent;   Fair  Judgement:  Fair  Insight:  Fair  Psychomotor Activity:  Normal  Concentration:  Fair  Recall:  Fair  Akathisia:  Negative  Handed:  Right  AIMS (if indicated):     Assets:  Communication Skills Desire for Improvement Financial Resources/Insurance Housing  Sleep:        Assessment: 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 Insomnia. Fatigue other  Axis II: Deferred  Axis III:  Past Medical History  Diagnosis Date  . Shoulder injury   . Anxiety   . Depression   . Headache(784.0)     Axis IV: Psychosocial financial   Treatment Plan and Summary: Lexapro 10 mg prescription written for 90 days.  Take trazadone prn for sleep.  Fatigue improved while he sleeps better.  He will bring his lab work to review TSH or any other causes for tiredness.  Discussed weight maintenance and Sleep Hygiene in regard to his insomnia and how to work on his difficulty sleeping Follow up with Primary care provider in regards to Medical conditions. Recommend compliance with medications and follow up office appointments. Discussed to avail opportunity to consider or/and continue Individual therapy with Counselor. Greater than 50% of time was spend in counseling and coordination of care with the patient.  Schedule for Follow up visit in 8   weeks  or call in earlier as necessary.  Time spent: 25 minutes  Thresa Ross, MD 05/18/2015

## 2015-08-20 ENCOUNTER — Ambulatory Visit (INDEPENDENT_AMBULATORY_CARE_PROVIDER_SITE_OTHER): Payer: Managed Care, Other (non HMO) | Admitting: Psychiatry

## 2015-08-20 ENCOUNTER — Encounter (HOSPITAL_COMMUNITY): Payer: Self-pay | Admitting: Psychiatry

## 2015-08-20 VITALS — BP 124/70 | HR 94 | Ht 72.0 in | Wt 216.0 lb

## 2015-08-20 DIAGNOSIS — R5383 Other fatigue: Secondary | ICD-10-CM | POA: Diagnosis not present

## 2015-08-20 DIAGNOSIS — F331 Major depressive disorder, recurrent, moderate: Secondary | ICD-10-CM

## 2015-08-20 DIAGNOSIS — G4700 Insomnia, unspecified: Secondary | ICD-10-CM | POA: Diagnosis not present

## 2015-08-20 MED ORDER — ESCITALOPRAM OXALATE 10 MG PO TABS
10.0000 mg | ORAL_TABLET | Freq: Every day | ORAL | Status: DC
Start: 2015-08-20 — End: 2015-11-27

## 2015-08-20 NOTE — Progress Notes (Signed)
Patient ID: James Conley, male   DOB: Apr 20, 1966, 49 y.o.   MRN: [id number] [facility] Follow up Outpatient visit  James Conley [id number] 49 y.o.  08/20/2015 9:07 AM  Chief Complaint:  depression  History of Present Illness:   Patient Presents for follow up visit for medication management and diagnosis of depression, insomnia and fatigue. Patient is currently married Caucasian. [place] to [place].  Iinitially endorsed depression for the last few weeks but says that depression has been there for many years including 15-20 years. His dad died 16 years ago but even before that he was feeling depressed Last visit we added trazodone 50 mg for insomnia. He does sleep better on trazodone sometime he does not take it. His fatigue is also improved somewhat since he states better. Says that since trazodone knocks him out he does not want to take it every day he talked about cutting down the dose to half a tablet if needed.  Severity of depression now 8/10. 10 being v happy. Depression is stable. Tolerating lower lexapro dose . Still has problems with sleep at night. Takes trazadone prn Fatigue: at times not worsened.  Medical complexity: has Tsh done, says its normal coudnt print the results out Other triggers for depression are conflicts at home and in relationship which remains somewhat distant.  Modifying factor: work. But people are getting laid off.   Duration of depression more than around 15 years or more  years timing possibly related to father's death and relationship. Does not endorse psychotic symptoms currently or in past. Also no manic symptoms currently or in the past does not endorse any anxiety to the point of having panic attacks  Also the states his job is 40 hours mostly from home but stressful but he still able to function but is feeling somewhat difficult to focus  in this project recently.    Past Psychiatric  History/Hospitalization(s) denies  Hospitalization for psychiatric illness: No History of Electroconvulsive Shock Therapy: No Prior Suicide Attempts: No  Medical History; Past Medical History  Diagnosis Date  . Shoulder injury   . Anxiety   . Depression   . Headache(784.0)     Allergies: No Known Allergies  Medications: Outpatient Encounter Prescriptions as of 08/20/2015  Medication Sig  . amLODipine (NORVASC) 2.5 MG tablet   . escitalopram (LEXAPRO) 10 MG tablet Take 1 tablet (10 mg total) by mouth daily.  . fenofibrate 160 MG tablet Take 160 mg by mouth daily.  . traZODone (DESYREL) 50 MG tablet Take 1 tablet (50 mg total) by mouth at bedtime as needed for sleep.  . [DISCONTINUED] escitalopram (LEXAPRO) 10 MG tablet Take 1 tablet (10 mg total) by mouth daily.   No facility-administered encounter medications on file as of 08/20/2015.     Substance Abuse History:   Family History; Family History  Problem Relation Age of Onset  . Anxiety disorder Mother    Mom had anxiety and depression.      Labs:  No results found for this or any previous visit (from the past 2160 hour(s)).     Musculoskeletal: Strength & Muscle Tone: within normal limits Gait & Station: normal Patient leans: N/A  Mental Status Examination;   Psychiatric Specialty Exam: Physical Exam  Constitutional: He appears well-developed and well-nourished.  HENT:  Head: Normocephalic and atraumatic.  Neck: Neck supple.    Review of Systems  Cardiovascular: Negative for chest pain.  Gastrointestinal: Negative for nausea.  Neurological: Negative for tremors.  Psychiatric/Behavioral: Negative for suicidal ideas.    Blood pressure 124/70, pulse 94, height 6' (1.829 m), weight 216 lb (97.977 kg), SpO2 94 %.Body mass index is 29.29 kg/(m^2).  General Appearance: Casual  Eye Contact::  Fair  Speech:  Normal Rate  Volume:  Decreased  Mood:  Less dysphoric  Affect:  Congruent  Thought  Process:  Linear  Orientation:  Full (Time, Place, and Person)  Thought Content:  Rumination  Suicidal Thoughts:  No  Homicidal Thoughts:  No  Memory:  Immediate;   Fair Recent;   Fair  Judgement:  Fair  Insight:  Fair  Psychomotor Activity:  Normal  Concentration:  Fair  Recall:  Fair  Akathisia:  Negative  Handed:  Right  AIMS (if indicated):     Assets:  Communication Skills Desire for Improvement Financial Resources/Insurance Housing  Sleep:        Assessment: 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 Insomnia. Fatigue other  Axis II: Deferred  Axis III:  Past Medical History  Diagnosis Date  . Shoulder injury   . Anxiety   . Depression   . Headache(784.0)     Axis IV: Psychosocial financial   Treatment Plan and Summary: Lexapro 10 mg prescription written for 90 days.  Take trazadone prn for sleep.  Has refill Fatigue not worsened.  He will bring his lab work to review TSH or any other causes for tiredness.  Discussed weight maintenance and Sleep Hygiene in regard to his insomnia and how to work on his difficulty sleeping Follow up with Primary care provider in regards to Medical conditions. Recommend compliance with medications and follow up office appointments. Discussed to avail opportunity to consider or/and continue Individual therapy with Counselor. Greater than 50% of time was spend in counseling and coordination of care with the patient.  Schedule for Follow up visit in 12   weeks  or call in earlier as necessary.  Time spent: 25 minutes  Thresa Ross, MD 08/20/2015

## 2015-11-19 ENCOUNTER — Ambulatory Visit (HOSPITAL_COMMUNITY): Payer: Managed Care, Other (non HMO) | Admitting: Psychiatry

## 2015-11-27 ENCOUNTER — Ambulatory Visit (INDEPENDENT_AMBULATORY_CARE_PROVIDER_SITE_OTHER): Payer: Managed Care, Other (non HMO) | Admitting: Psychiatry

## 2015-11-27 ENCOUNTER — Encounter (HOSPITAL_COMMUNITY): Payer: Self-pay | Admitting: Psychiatry

## 2015-11-27 VITALS — BP 122/68 | HR 92 | Ht 72.0 in | Wt 219.0 lb

## 2015-11-27 DIAGNOSIS — G4700 Insomnia, unspecified: Secondary | ICD-10-CM | POA: Diagnosis not present

## 2015-11-27 DIAGNOSIS — R5383 Other fatigue: Secondary | ICD-10-CM | POA: Diagnosis not present

## 2015-11-27 DIAGNOSIS — F331 Major depressive disorder, recurrent, moderate: Secondary | ICD-10-CM | POA: Diagnosis not present

## 2015-11-27 MED ORDER — ESCITALOPRAM OXALATE 10 MG PO TABS: 10.0000 mg | ORAL_TABLET | Freq: Every day | ORAL | Status: DC

## 2015-11-27 MED ORDER — TRAZODONE HCL 50 MG PO TABS: 50.0000 mg | ORAL_TABLET | Freq: Every evening | ORAL | Status: DC | PRN

## 2015-11-27 NOTE — Progress Notes (Signed)
Patient ID: James Conley, male   DOB: 1965/10/18, 50 y.o.   MRN: [id number] [facility] Follow up Outpatient visit  James Conley [id number] 49 y.o.  11/27/2015 11:36 AM  Chief Complaint:  depression  History of Present Illness:   Patient Presents for follow up visit for medication management and diagnosis of depression, insomnia and fatigue. Patient is currently married Caucasian. Relocated from  [place] to [place].  Initially has endorsed depression. Depression is reasonable with the Lexapro tolerating it well. Occasional sleep issues takes trazodone but not little regular basis sometimes has stuffy nose when takes trazodone. Fatigue varies if he has had enough sleep Some job stress as people have been laid down but he said he will worry more   if it happens  Severity of depression now 8/10. 10 being v happy. Depression is stable.   Fatigue: at times not worsened.  Medical complexity: has Tsh done reported to be normal Other triggers for depression are conflicts at home and in relationship which remains somewhat distant. No change there Modifying factor: work. But people are getting laid off.   Duration of depression more than around 16 years or more  years timing possibly related to father's death and relationship. Does not endorse psychotic symptoms currently or in past. Also no manic symptoms currently or in the past does not endorse any anxiety to the point of having panic attacks  Also the states his job is 40 hours mostly from home but stressful but he still able to function but is feeling somewhat difficult to focus  in this project recently.    Past Psychiatric History/Hospitalization(s) denies  Hospitalization for psychiatric illness: No History of Electroconvulsive Shock Therapy: No Prior Suicide Attempts: No  Medical History; Past Medical History  Diagnosis Date  . Shoulder injury   . Anxiety   . Depression   . Headache(784.0)      Allergies: No Known Allergies  Medications: Outpatient Encounter Prescriptions as of 11/27/2015  Medication Sig  . amLODipine (NORVASC) 2.5 MG tablet   . escitalopram (LEXAPRO) 10 MG tablet Take 1 tablet (10 mg total) by mouth daily.  . fenofibrate 160 MG tablet Take 160 mg by mouth daily.  . traZODone (DESYREL) 50 MG tablet Take 1 tablet (50 mg total) by mouth at bedtime as needed for sleep.  . [DISCONTINUED] escitalopram (LEXAPRO) 10 MG tablet Take 1 tablet (10 mg total) by mouth daily.  . [DISCONTINUED] traZODone (DESYREL) 50 MG tablet Take 1 tablet (50 mg total) by mouth at bedtime as needed for sleep.   No facility-administered encounter medications on file as of 11/27/2015.     Family History; Family History  Problem Relation Age of Onset  . Anxiety disorder Mother      Labs:  No results found for this or any previous visit (from the past 2160 hour(s)).   Musculoskeletal: Strength & Muscle Tone: within normal limits Gait & Station: normal Patient leans: N/A  Mental Status Examination;   Psychiatric Specialty Exam: Physical Exam  Constitutional: He appears well-developed and well-nourished.  HENT:  Head: Normocephalic and atraumatic.  Neck: Neck supple.    Review of Systems  Cardiovascular: Negative for chest pain.  Gastrointestinal: Negative for nausea.  Neurological: Negative for tremors.  Psychiatric/Behavioral: Negative for depression and suicidal ideas. The patient has insomnia.     Blood pressure 122/68, pulse 92, height 6' (1.829 m), weight 219 lb (99.338 kg), SpO2 95 %.Body mass index is 29.7 kg/(m^2).  General Appearance: Casual  Eye  Contact::  Fair  Speech:  Normal Rate  Volume:  Decreased  Mood:  Less dysphoric  Affect:  Congruent  Thought Process:  Linear  Orientation:  Full (Time, Place, and Person)  Thought Content:  Rumination  Suicidal Thoughts:  No  Homicidal Thoughts:  No  Memory:  Immediate;   Fair Recent;   Fair  Judgement:   Fair  Insight:  Fair  Psychomotor Activity:  Normal  Concentration:  Fair  Recall:  Fair  Akathisia:  Negative  Handed:  Right  AIMS (if indicated):     Assets:  Communication Skills Desire for Improvement Financial Resources/Insurance Housing  Sleep:        Assessment: 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 Insomnia. Fatigue other  Axis II: Deferred  Axis III:  Past Medical History  Diagnosis Date  . Shoulder injury   . Anxiety   . Depression   . Headache(784.0)     Axis IV: Psychosocial financial   Treatment Plan and Summary: Lexapro 10 mg prescription written for 90 days.  Take trazadone prn for sleep.  Refill given Fatigue not worsened.  Continue to work on sleep hygiene. Has been on ambien in past if needed we can change to that.  Discussed weight maintenance and Sleep Hygiene in regard to his insomnia and how to work on his difficulty sleeping Follow up with Primary care provider in regards to Medical conditions. Recommend compliance with medications and follow up office appointments. Discussed to avail opportunity to consider or/and continue Individual therapy with Counselor. Greater than 50% of time was spend in counseling and coordination of care with the patient.  Schedule for Follow up visit in 12   weeks  or call in earlier as necessary.  Time spent: 25 minutes  Thresa Ross, MD 11/27/2015

## 2016-01-04 ENCOUNTER — Other Ambulatory Visit (HOSPITAL_COMMUNITY): Payer: Self-pay | Admitting: Psychiatry

## 2016-01-07 NOTE — Telephone Encounter (Signed)
Received medication request from [facility] for Lexapro 10mg . Per Dr. Gilmore LarocheAkhtar, medication request is denied. Rx for Lexapro 10mg , #90 was sent to pharmacy on 11/27/15. This was a 90 day supply. Pt is schedule for a f/u appt on 02/19/16.

## 2016-02-19 ENCOUNTER — Encounter (HOSPITAL_COMMUNITY): Payer: Self-pay | Admitting: Psychiatry

## 2016-02-19 ENCOUNTER — Ambulatory Visit (INDEPENDENT_AMBULATORY_CARE_PROVIDER_SITE_OTHER): Payer: Managed Care, Other (non HMO) | Admitting: Psychiatry

## 2016-02-19 DIAGNOSIS — G4700 Insomnia, unspecified: Secondary | ICD-10-CM

## 2016-02-19 DIAGNOSIS — R5383 Other fatigue: Secondary | ICD-10-CM | POA: Diagnosis not present

## 2016-02-19 DIAGNOSIS — F331 Major depressive disorder, recurrent, moderate: Secondary | ICD-10-CM | POA: Diagnosis not present

## 2016-02-19 MED ORDER — ESCITALOPRAM OXALATE 10 MG PO TABS: 10.0000 mg | ORAL_TABLET | Freq: Every day | ORAL | Status: DC

## 2016-02-19 NOTE — Progress Notes (Signed)
Patient ID: James Conley, male   DOB: 1966/01/18, 50 y.o.   MRN: [id number] [facility] Follow up Outpatient visit  James Conley [id number] 49 y.o.  02/19/2016 10:27 AM  Chief Complaint:  depression  History of Present Illness:   Patient Presents for follow up visit for medication management and diagnosis of depression, insomnia and fatigue. Patient is currently married Caucasian. Relocated from  [place] to [place].  Depression: not worsened. Takes lexapro.  Relationship has arguments which makes it worse at times. Some job stress as people have been laid down but he said he will worry more   if it happens Insomnia: not worsened. Takes trazadone prn.will not need refill Severity of depression now 8/10. 10 being v happy.  Fatigue: at times not worsened.   Other triggers for depression are conflicts at home and in relationship which remains somewhat distant. No change there Modifying factor: work. But people are getting laid off.   Duration of depression more than around 16 years or more  years timing possibly related to father's death and relationship. Does not endorse psychotic symptoms currently or in past. Also no manic symptoms currently or in the past does not endorse any anxiety to the point of having panic attacks  Also the states his job is 40 hours mostly from home but stressful but he still able to function but is feeling somewhat difficult to focus  in this project recently.    Past Psychiatric History/Hospitalization(s) denies  Hospitalization for psychiatric illness: No History of Electroconvulsive Shock Therapy: No Prior Suicide Attempts: No  Medical History; Past Medical History  Diagnosis Date  . Shoulder injury   . Anxiety   . Depression   . Headache(784.0)     Allergies: No Known Allergies  Medications: Outpatient Encounter Prescriptions as of 02/19/2016  Medication Sig  . amLODipine (NORVASC) 2.5 MG tablet   . escitalopram  (LEXAPRO) 10 MG tablet Take 1 tablet (10 mg total) by mouth daily.  . fenofibrate 160 MG tablet Take 160 mg by mouth daily.  . traZODone (DESYREL) 50 MG tablet Take 1 tablet (50 mg total) by mouth at bedtime as needed for sleep.  . [DISCONTINUED] escitalopram (LEXAPRO) 10 MG tablet Take 1 tablet (10 mg total) by mouth daily.   No facility-administered encounter medications on file as of 02/19/2016.     Family History; Family History  Problem Relation Age of Onset  . Anxiety disorder Mother      Labs:  No results found for this or any previous visit (from the past 2160 hour(s)).   Musculoskeletal: Strength & Muscle Tone: within normal limits Gait & Station: normal Patient leans: N/A  Mental Status Examination;   Psychiatric Specialty Exam: Physical Exam  Constitutional: He appears well-developed and well-nourished.  HENT:  Head: Normocephalic and atraumatic.  Neck: Neck supple.    Review of Systems  Cardiovascular: Negative for chest pain and palpitations.  Gastrointestinal: Negative for nausea.  Neurological: Negative for tremors.  Psychiatric/Behavioral: Negative for depression and suicidal ideas.    There were no vitals taken for this visit.There is no weight on file to calculate BMI.  General Appearance: Casual  Eye Contact::  Fair  Speech:  Normal Rate  Volume:  Decreased  Mood:  Less dysphoric  Affect:  Congruent  Thought Process:  Linear  Orientation:  Full (Time, Place, and Person)  Thought Content:  Rumination  Suicidal Thoughts:  No  Homicidal Thoughts:  No  Memory:  Immediate;   Fair Recent;  Fair  Judgement:  Fair  Insight:  Fair  Psychomotor Activity:  Normal  Concentration:  Fair  Recall:  Fair  Akathisia:  Negative  Handed:  Right  AIMS (if indicated):     Assets:  Communication Skills Desire for Improvement Financial Resources/Insurance Housing  Sleep:        Assessment: 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  Insomnia. Fatigue other  Axis II: Deferred  Axis III:  Past Medical History  Diagnosis Date  . Shoulder injury   . Anxiety   . Depression   . Headache(784.0)     Axis IV: Psychosocial financial   Treatment Plan and Summary: Lexapro 10 mg prescription written for 90 days.  Take trazadone prn for sleep.  Refill not needed yet Fatigue not worsened.  Continue to work on sleep hygiene.  Discussed weight maintenance and Sleep Hygiene in regard to his insomnia and how to work on his difficulty sleeping Follow up with Primary care provider in regards to Medical conditions. Recommend compliance with medications and follow up office appointments. Discussed to avail opportunity to consider or/and continue Individual therapy with Counselor. Greater than 50% of time was spend in counseling and coordination of care with the patient.  Schedule for Follow up visit in 12   weeks  or call in earlier as necessary. Consider to involve wife in therapy Time spent: 25 minutes  Thresa RossAKHTAR, Hussain Maimone, MD 02/19/2016

## 2016-03-04 ENCOUNTER — Ambulatory Visit (INDEPENDENT_AMBULATORY_CARE_PROVIDER_SITE_OTHER): Payer: Managed Care, Other (non HMO) | Admitting: Neurology

## 2016-03-04 ENCOUNTER — Encounter: Payer: Self-pay | Admitting: Neurology

## 2016-03-04 VITALS — BP 142/98 | HR 86 | Resp 20 | Ht 72.0 in | Wt 214.0 lb

## 2016-03-04 DIAGNOSIS — M5010 Cervical disc disorder with radiculopathy, unspecified cervical region: Secondary | ICD-10-CM

## 2016-03-04 NOTE — Progress Notes (Signed)
[facility]    Provider:  Dr Lucia Gaskins Referring Provider: Venita Lick, MD Primary Care Physician:  Redmond Baseman, MD  CC:  Weakness and paresthesias right arm  HPI:  James Conley is a 50 y.o. male here as a referral from Dr. Shon Baton for weakness of the right arm. Past medical history of migraine, depression, hyperlipidemia. On the 21st of January noticed weakness in the extensor hand and forearm. He had tingling and burning from the elbow that extended up to the axilla. It was in the middle of the day and he bent over the pick up a piece of cardboard, that is when the tingling and the burning started. No trauma, no other possible inciting event such as didn't sleep it on it wrong. It hasn't gotten any better since then and he has progressive weakness. He has weakness of extension of wrist and in the fingers. No other past medical history that he can think of that would cause such changes, he may have been told he had a maxillary cyst when having his shoulder worked on, but unclear with exercise. He had a course of prednisone, also had physical therapy and that didn't help. The pain radiating into the axilla happened at the same time as the weakness. He has pain in the elbow now that radiates to the fingers. He was evaluated by Dr. Shon Baton and sent here for another opinion. He was diagnosed with C7 radiculopathy and possible radial nerve compression after EMG nerve conduction study. Imaging of his arm failed to show any etiology for radial nerve compression. Patient's pain is rated as moderate to severe. Using his arm makes it worse and resting makes it better. No other deficits, no neck pain, no dysarthria or dysphasia, no weight loss, no muscle, no bowel or bladder changes. No family history of neuromuscular disorder or neuropathy.  Reviewed notes, labs and imaging from outside physicians, which showed:  MRI of the upper extremity May 2017 compatible with a partial tear of  the flexor digitorum superficialis myotendinous junction 3 cm distal to the level of the radial tuberosity.  MRI of the elbow on the right in May 2017 showed unremarkable MRI of the elbow no compression of the radial nerve for included proximal portion interosseous nerve.  MRI of the cervical spine March 2017, personally reviewed images and agree with the following:  Multilevel degenerative changes of the cervical spine with high-grade neural foraminal narrowing. At C3-C4 moderate to severe left neural foraminal narrowing with probable encroachment of the left C4 dorsal root ganglion. At C5-C6 severe right neural foraminal narrowing with encroachment on the right C6 dorsal root ganglia. A C6-C7 moderate to severe bilateral neural foraminal narrowing with probable encroachment on the C7 dorsal root ganglia. At C7-T1 moderate to severe right neural foraminal narrowing with probable encroachment on the right C8 dorsal root ganglion.  Patient was evaluated and an EMG nerve conduction study was performed. He was seen by [facility] orthopedics for weakness, no neck pain, he describes numbness, with pain. Corticosteroid use didn't help. He reported pain and weakness involving the right hand, right forearm and right elbow without any known injury. Pain sharp and burning moderate to severe. Symptoms worsened by motion at the wrist, use of arm, caring and repetitive motion. Symptoms improved with restricted activity.  Personally reviewed EMG findings which included normal nerve conduction studies: Right median motor and sensory, right ulnar motor and sensory, right radial sensory. EMG of the right upper limb was normal except for the right  extensor indicis proprius and extensor carpi radialis muscles were there was increased insertional activity 2+ positive Phipps, 2+ positive sharp waves and reduced recruitment. The other muscles that were normal tested included cervical paraspinous, trapezius, abductor pollicis  brevis, first dorsal interosseous, extensor digitorum communis, flexor pollicis longus, brachial radialis, flexor digitorum superficialis, pronator teres, biceps, triceps and deltoid muscles. 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  Review of Systems: Patient complains of symptoms per HPI as well as the following symptoms: Headache, numbness, weakness, insomnia, depression, not enough sleep. Pertinent negatives per HPI. All others negative.   Social History   Social History  . Marital Status: Married    Spouse Name: N/A  . Number of Children: N/A  . Years of Education: N/A   Occupational History  . Not on file.   Social History Main Topics  . Smoking status: Never Smoker   . Smokeless tobacco: Not on file  . Alcohol Use: 0.0 oz/week    0 Standard drinks or equivalent per week     Comment: rare  . Drug Use: No  . Sexual Activity:    Partners: Female   Other Topics Concern  . Not on file   Social History Narrative    Family History  Problem Relation Age of Onset  . Anxiety disorder Mother   . Hypertension Maternal Grandmother   . Heart disease Maternal Grandfather   . Myasthenia gravis Maternal Grandfather     Past Medical History  Diagnosis Date  . Shoulder injury   . Anxiety   . Depression   . Headache(784.0)   . High cholesterol     Past Surgical History  Procedure Laterality Date  . Shoulder surgery    . Left ankle surgery      Current Outpatient Prescriptions  Medication Sig Dispense Refill  . amLODipine (NORVASC) 2.5 MG tablet     . aspirin 81 MG tablet Take 81 mg by mouth daily.    Marland Kitchen. escitalopram (LEXAPRO) 10 MG tablet Take 1 tablet (10 mg total) by mouth daily. 90 tablet 0  . fenofibrate 160 MG tablet Take 160 mg by mouth daily.  0  . traZODone (DESYREL) 50 MG tablet Take 1 tablet (50 mg total) by mouth at bedtime as needed for sleep. 30  tablet 0   No current facility-administered medications for this visit.    Allergies as of 03/04/2016  . (No Known Allergies)    Vitals: BP 142/98 mmHg  Pulse 86  Resp 20  Ht 6' (1.829 m)  Wt 214 lb (97.07 kg)  BMI 29.02 kg/m2 Last Weight:  Wt Readings from Last 1 Encounters:  03/04/16 214 lb (97.07 kg)   Last Height:   Ht Readings from Last 1 Encounters:  03/04/16 6' (1.829 m)   Physical exam: Exam: Gen: NAD, conversant, well nourised, obese, well groomed                     CV: RRR, no MRG. No Carotid Bruits. No peripheral edema, warm, nontender Eyes: Conjunctivae clear without exudates or hemorrhage  Neuro: Detailed Neurologic Exam  Speech:    Speech is normal; fluent and spontaneous with normal comprehension.  Cognition:    The patient is oriented to person, place, and time;     recent and remote memory intact;     language fluent;     normal attention, concentration,     fund of knowledge  Cranial Nerves:    The pupils are equal, round, and reactive to light. The fundi are normal and spontaneous venous pulsations are present. Visual fields are full to finger confrontation. Extraocular movements are intact. Trigeminal sensation is intact and the muscles of mastication are normal. The face is symmetric. The palate elevates in the midline. Hearing intact. Voice is normal. Shoulder shrug is normal. The tongue has normal motion without fasciculations.   Coordination:    Normal finger to nose and heel to shin. Normal rapid alternating movements.   Gait:    Heel-toe and tandem gait are normal.   Motor Observation:    No abnormal involuntary movements. Tone:    Normal muscle tone.    Posture:    Posture is normal. normal erect    Strength: Right arm: Weakness of the triceps right 4+/5 Weakness of wrist extension and finger extension 3/5 Ulnar interossei weakness 4/5 Flexor digitorum profundus (ulnar) weakness in digit 5 more than digit 4 Mild weakness of  the APB  Otherwise strength 5/5     Sensation: intact to LT     Reflex Exam:  DTR's:    Deep tendon reflexes in the upper and lower extremities are normal bilaterally.   Toes:    The toes are downgoing bilaterally.   Clonus:    Clonus is absent.       Assessment/Plan:   50 y.o. male here as a referral from Dr. Shon Baton for weakness of the right arm. EMG nerve conduction study showed normal conductions including normal sensory conductions with acute/ongoing denervation and chronic neurogenic changes seen in radial muscles in the posterior interosseous nerve distribution. Elbow and arm  MRI however negative for any causes of radial nerve compression. On exam he does indeed have weakness in the forearm extensor radial muscles, also in the triceps but he also has ulnar weakness including ulnar interossei and distal ulnar finger flexors and possible median intrinsic hand muscles. Given normal sensory conductions on NCS, and MRI showing severe neural foraminal stenosis in the right C7 and C8 roots with likely compression of these roots, I would favor cervical radiculopathy over neuropathy. EMG nerve conduction study performed by a colleague was exceptionally thorough however often on repeat EMG further denervation can be found to add additional information. We will perform a repeat EMG nerve conduction study to evaluate for denervation that may not have been there yet on initial EMG.   CC: Drs Modesto Charon and Drs Gean Quint, MD  [facility]: [facility] [address] [place], [place] [address]  Phone [phone] Fax [phone]

## 2016-03-07 ENCOUNTER — Ambulatory Visit (INDEPENDENT_AMBULATORY_CARE_PROVIDER_SITE_OTHER): Payer: Managed Care, Other (non HMO) | Admitting: Neurology

## 2016-03-07 ENCOUNTER — Ambulatory Visit (INDEPENDENT_AMBULATORY_CARE_PROVIDER_SITE_OTHER): Payer: Self-pay | Admitting: Neurology

## 2016-03-07 DIAGNOSIS — R202 Paresthesia of skin: Secondary | ICD-10-CM

## 2016-03-07 DIAGNOSIS — R29898 Other symptoms and signs involving the musculoskeletal system: Secondary | ICD-10-CM

## 2016-03-07 DIAGNOSIS — M5010 Cervical disc disorder with radiculopathy, unspecified cervical region: Secondary | ICD-10-CM

## 2016-03-07 DIAGNOSIS — Z0289 Encounter for other administrative examinations: Secondary | ICD-10-CM

## 2016-03-07 NOTE — Progress Notes (Signed)
  [facility]  Provider:  Dr Lucia Gaskins Referring Provider: Ileana Ladd, MD Primary Care Physician:  Redmond Baseman, MD  HPI:  James Conley is a 50 y.o. male here for evaluation of right arm weakness.  Clinical exam shows weakness in the forearm extensor radial muscles and in the triceps but he also has ulnar weakness including ulnar interossei and distal ulnar finger flexors and possible median intrinsic hand muscles.  MRI of the cervical spine March 2017:  Multilevel degenerative changes of the cervical spine with high-grade neural foraminal narrowing. At C3-C4 moderate to severe left neural foraminal narrowing with probable encroachment of the left C4 dorsal root ganglion. At C5-C6 severe right neural foraminal narrowing with encroachment on the right C6 dorsal root ganglia. A C6-C7 moderate to severe bilateral neural foraminal narrowing with probable encroachment on the C7 dorsal root ganglia. At C7-T1 moderate to severe right neural foraminal narrowing with probable encroachment on the right C8 dorsal root ganglion. Summary  Nerve conduction studies were performed on the right upper extremity:  Right APB Median motor nerve showed normal conductions with normal F Wave latency Right Ulnar ADM motor nerve showed normal conductions with normal F Wave latency Right Ulnar FDI motor nerve showed normal conductions Right Radial motor nerve showed normal conductions (elbow, axilla and Erbs) Right second-digit Median sensory nerve conduction was within normal limits Right fifth-digit Ulnar sensory nerve conduction was within normal limits Right Radial sensory nerve conduction was within normal limits Right Lateral Antebrachial Cutaneous Sensory nerve was within normal limits Right Medial Antebrachial Cutaneous Sensory nerve was within normal limits  EMG needle evaluation was performed on selected right upper extremity muscles:   The right triceps showed increased  spontaneous activity (+3 positive sharp waves) and diminished motor unit recruitment The right extensor carpi radialis showed increased spontaneous activity (+2 positive sharp waves) and diminished motor unit recruitment The right extensor digitorum communis showed increased spontaneous activity (+2 positive sharp waves) The right extensor indices showed increased spontaneous activity (+4 positive sharp waves) and diminished motor unit recruitment The right flexor digitorum profundus showed increased spontaneous activity (+4 positive sharp waves) and diminished motor unit recruitment The right first dorsal interosseous showed increased spontaneous activity (+3 small chronic positive sharp waves) and diminished motor unit recruitment  The following muscles were within normal limits: Supraspinatus, infraspinatus, deltoid, biceps, brachioradialis, brachialis, pronator teres, opponens pollicis, flexor pollicis longus. Attempted right lower cervical paraspinals which showed too much motor artifact to be evaluated adequately.   Assessment/Plan:  EMG needle examination demonstrates acute/ongoing denervation and chronic neurogenic changes in right upper-extremity muscles that share C7,C8 innervation. Given normal sensory conductions, this is consistent with right C7,C8 radiculopathy.   Naomie Dean, MD  [facility] [address] [place], [place] [address]  Phone [phone] Fax [phone]

## 2016-03-08 DIAGNOSIS — M5010 Cervical disc disorder with radiculopathy, unspecified cervical region: Secondary | ICD-10-CM | POA: Insufficient documentation

## 2016-03-08 NOTE — Patient Instructions (Signed)
Overall you are doing fairly well but I do want to suggest a few things today:   Remember to drink plenty of fluid, eat healthy meals and do not skip any meals. Try to eat protein with a every meal and eat a healthy snack such as fruit or nuts in between meals. Try to keep a regular sleep-wake schedule and try to exercise daily, particularly in the form of walking, 20-30 minutes a day, if you can.   As far as diagnostic testing: emg/ncs  I would like to see you back in one week for emg/ncs, sooner if we need to. Please call us with any interim questions, concerns, problems, updates or refill requests.   Our phone number is [phone]. We also have an after hours call service for urgent matters and there is a physician on-call for urgent questions. For any emergencies you know to call 911 or go to the nearest emergency room

## 2016-03-08 NOTE — Progress Notes (Signed)
See procedure note.

## 2016-03-10 NOTE — Procedures (Signed)
[facility]  Provider:  Dr Lucia GaskinsAhern Referring Provider: Ileana LaddWong, Francis P, MD Primary Care Physician:  Redmond BasemanWONG,FRANCIS PATRICK, MD  HPI:  James SacramentoBrian Odowd is a 50 y.o. male here for evaluation of right arm weakness.  Clinical exam shows weakness in the forearm extensor radial muscles and in the triceps but he also has ulnar weakness including ulnar interossei and distal ulnar finger flexors and possible median intrinsic hand muscles.  MRI of the cervical spine March 2017:  Multilevel degenerative changes of the cervical spine with high-grade neural foraminal narrowing. At C3-C4 moderate to severe left neural foraminal narrowing with probable encroachment of the left C4 dorsal root ganglion. At C5-C6 severe right neural foraminal narrowing with encroachment on the right C6 dorsal root ganglia. A C6-C7 moderate to severe bilateral neural foraminal narrowing with probable encroachment on the C7 dorsal root ganglia. At C7-T1 moderate to severe right neural foraminal narrowing with probable encroachment on the right C8 dorsal root ganglion. Summary  Nerve conduction studies were performed on the right upper extremity:  Right APB Median motor nerve showed normal conductions with normal F Wave latency Right Ulnar ADM motor nerve showed normal conductions with normal F Wave latency Right Ulnar FDI motor nerve showed normal conductions Right Radial motor nerve showed normal conductions (elbow, axilla and Erbs) Right second-digit Median sensory nerve conduction was within normal limits Right fifth-digit Ulnar sensory nerve conduction was within normal limits Right Radial sensory nerve conduction was within normal limits Right Lateral Antebrachial Cutaneous Sensory nerve was within normal limits Right Medial Antebrachial Cutaneous Sensory nerve was within normal limits  EMG needle evaluation was performed on selected right upper extremity muscles:   The right triceps showed increased  spontaneous activity (+3 positive sharp waves) and diminished motor unit recruitment The right extensor carpi radialis showed increased spontaneous activity (+2 positive sharp waves) and diminished motor unit recruitment The right extensor digitorum communis showed increased spontaneous activity (+2 positive sharp waves) The right extensor indices showed increased spontaneous activity (+4 positive sharp waves) and diminished motor unit recruitment The right flexor digitorum profundus showed increased spontaneous activity (+4 positive sharp waves) and diminished motor unit recruitment The right first dorsal interosseous showed increased spontaneous activity (+3 small chronic positive sharp waves) and diminished motor unit recruitment  The following muscles were within normal limits: Supraspinatus, infraspinatus, deltoid, biceps, brachioradialis, brachialis, pronator teres, opponens pollicis, flexor pollicis longus. Attempted right lower cervical paraspinals which showed too much motor artifact to be evaluated adequately.   Assessment/Plan:  EMG needle examination demonstrates acute/ongoing denervation and chronic neurogenic changes in right upper-extremity muscles that share C7,C8 innervation. Given normal sensory conductions, this is consistent with right C7,C8 radiculopathy.   Naomie DeanAntonia Ahern, MD  [facility] [address] [place], [place] [id number]  Phone [phone] Fax [phone]

## 2016-03-13 ENCOUNTER — Telehealth: Payer: Self-pay | Admitting: Neurology

## 2016-03-13 NOTE — Telephone Encounter (Signed)
Pt request NCV/EMG results

## 2016-03-15 NOTE — Telephone Encounter (Addendum)
Called to discuss with patient, but left message as he did not answer. clinical exam and emg/ncs c/w radiculopathy. He should follow up again with Venita Lickahari Brooks who is an Presenter, broadcastingexcellent surgeon. I have sent my office note and emg/ncs results to Dr. Shon BatonBrooks this past week. I think patient should call and schedule appointment.  I am ccing Dr. Shon BatonBrooks on this communication, please give me a call on my cell phone if you would like to discuss, [phone]. I am happy to provide any other workup for patient per your clinical judgement thanks.

## 2016-03-16 NOTE — Telephone Encounter (Signed)
Called and left another message. Relayed that tests show likely c7,c8 radiculopathy. I am happy to try and expedite appointment with Dr. Shon BatonBrooks if he needs me to, he should call the office otherwise call Dr. Shon BatonBrooks directly. thanks

## 2016-03-17 NOTE — Telephone Encounter (Signed)
Patient is calling and states Dr. Shon BatonBrooks wants him to see Dr. Lucia GaskinsAhern again before his appointment with Dr. Shon BatonBrooks on 03/24/16.  He does not know what this appt is about.  Could you please call him [phone].

## 2016-03-17 NOTE — Telephone Encounter (Signed)
I spoke to Dr. Shon BatonBrooks. I think patient has cervical radiculopathy but Dr. Shon BatonBrooks is hesitant, doesn't think the MRI images are too significant at C7/C8. Dr. Shon BatonBrooks would like a second opinion from one of my colleagues. I will set that up.

## 2016-03-17 NOTE — Telephone Encounter (Signed)
I have spoken with James Conley this afternoon and advised AA does not require a return visit at this time.  EMG/NCV was done and AA discussed results with him at that time.  He verbalized understanding of same./fim

## 2016-03-18 NOTE — Telephone Encounter (Signed)
I think accidentally forwarded to me

## 2016-03-18 NOTE — Telephone Encounter (Signed)
Thank you James Conley.  I have patient with possible C7,C8 radiculopathy based on exam and emg/ncs findings. He was sent to me diagnosed with radial neuropathy but on clinical exam he had weakness in both radial and ulnar (also some median) muscles and emg/ncs confirmed acute/onging denervation in other nerves as well (Dr. Anne HahnWillis came in to the emg room and confirmed that we saw denervation in radial as well as ulnar muscles) with normal sensory conductions. Dr. Shon BatonBrooks does not think the imaging of his neck is c/w radiculopathy and would like another opinion before proceeding to decompression surgery which I think is very wise. Thank you.

## 2016-03-27 ENCOUNTER — Encounter: Payer: Self-pay | Admitting: Neurology

## 2016-03-27 ENCOUNTER — Ambulatory Visit (INDEPENDENT_AMBULATORY_CARE_PROVIDER_SITE_OTHER): Payer: Managed Care, Other (non HMO) | Admitting: Neurology

## 2016-03-27 VITALS — BP 160/110 | HR 76 | Resp 16 | Ht 72.0 in | Wt 219.0 lb

## 2016-03-27 DIAGNOSIS — M5010 Cervical disc disorder with radiculopathy, unspecified cervical region: Secondary | ICD-10-CM

## 2016-03-27 DIAGNOSIS — R29898 Other symptoms and signs involving the musculoskeletal system: Secondary | ICD-10-CM | POA: Insufficient documentation

## 2016-03-27 NOTE — Progress Notes (Signed)
[facility]  PATIENT: James Conley DOB: 1965-11-26  REFERRING DOCTOR OR PCP:  Dr. Shon BatonBrooks (Ortho)  _________________________________   HISTORICAL  CHIEF COMPLAINT:  Chief Complaint  Patient presents with  . Right Arm/Hand Weakness    Here for 2nd opinion on numbness/weakness right arm/hand.  EMG/NCV in emr.  MRI c-spine done in March/fim    HISTORY OF PRESENT ILLNESS:  I had the pleasure of seeing your patient, James Conley[facility] for a neurologic second opinion regarding his painless right arm weakness.  As you know, he is a 50 year old man in good health who had the onset of weakness in the right arm in January 2017. He felt the weakness developed over a day or so.    He initially noted decreased strength in the hand, especially when he was writing. He also noted mild weakness wrist extension. He was able to pinch.   There was a little bit of tingling in the dorsum of the hand, adjacent to the third fourth and fifth fingers.  However, there was no pain in the neck, axilla, shoulder or arm.   He was initially evaluated by Dr. Shon BatonBrooks and then had a NCV/EMG by Dr. Ethelene Halamos and was diagnosed with radial neuropathy below the elbow.  Due to possible mismatch between MRI and NCV/EMG, he was referred to Dr. Lucia GaskinsAhern for an additional opinion.   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  Currently, he denies any neck, shoulder, axilla, arm or hand pain and rarely gets mild numbness on the dorsum of the hand. He feels that the strength has continued to weaken over the last few months. He notes mild atrophy in the hand and forearm and notes gloves are looser on the right hand than the left hand.     I personally reviewed notes from Dr. Lucia GaskinsAhern and Dr. Shon BatonBrooks (summarized above). I also personally reviewed the MRI images of the C- spine and the EMG/NCV.      The MRI of the cervical spine shows degenerative changes that are  maximal at C5-C6, C6-C7 and C7-T1. There is moderately severe to severe right foraminal narrowing at C5-C6, C6-C7 and at C7-T1. There is mild foraminal narrowing to the left at C5C6, moderate foraminal narrowing to the left at C6-C7 and the no narrowing on the left at C7-T1 is widely patent.   There is also moderately severe left foraminal narrowing at C3-C4.   The motor and sensory nerve conduction studies were normal. However, EMG showed acute or ongoing elevation in the right triceps, ECR, EDC, EIP, FDP and FDIO.     The supraspinatus, infraspinatus, deltoid, biceps, brachioradialis, brachialis, pronator teres, opponens pollicis, FPL were normal. Paraspinal muscles were difficult to evaluate due to motor artifact.  REVIEW OF SYSTEMS: Constitutional: No fevers, chills, sweats, or change in appetite Ear, nose and throat: No hearing loss, ear pain, nasal congestion, sore throat Cardiovascular: No chest pain, palpitations Respiratory: No shortness of breath at rest or with exertion.   No wheezes GastrointestinaI: No nausea, vomiting, diarrhea, abdominal pain, fecal incontinence Genitourinary: No dysuria, urinary retention or frequency.  No nocturia. Musculoskeletal: No neck pain, back pain Integumentary: No rash, pruritus, skin lesions Neurological: as above Psychiatric: Notes some depression.  No anxiety Endocrine: No palpitations, diaphoresis, change in appetite, change in weigh or increased thirst Hematologic/Lymphatic: No anemia, purpura, petechiae. Allergic/Immunologic: No itchy/runny eyes, nasal congestion, recent allergic reactions, rashes  ALLERGIES: No Known Allergies  HOME MEDICATIONS:  Current  outpatient prescriptions:  .  amLODipine (NORVASC) 2.5 MG tablet, , Disp: , Rfl:  .  aspirin 81 MG tablet, Take 81 mg by mouth daily., Disp: , Rfl:  .  escitalopram (LEXAPRO) 10 MG tablet, Take 1 tablet (10 mg total) by mouth daily., Disp: 90 tablet, Rfl: 0 .  fenofibrate 160 MG tablet,  Take 160 mg by mouth daily., Disp: , Rfl: 0 .  traZODone (DESYREL) 50 MG tablet, Take 1 tablet (50 mg total) by mouth at bedtime as needed for sleep. (Patient not taking: Reported on 03/27/2016), Disp: 30 tablet, Rfl: 0  PAST MEDICAL HISTORY: Past Medical History  Diagnosis Date  . Shoulder injury   . Anxiety   . Depression   . Headache(784.0)   . High cholesterol     PAST SURGICAL HISTORY: Past Surgical History  Procedure Laterality Date  . Shoulder surgery    . Left ankle surgery      FAMILY HISTORY: Family History  Problem Relation Age of Onset  . Anxiety disorder Mother   . Hypertension Maternal Grandmother   . Heart disease Maternal Grandfather   . Myasthenia gravis Maternal Grandfather     SOCIAL HISTORY:  Social History   Social History  . Marital Status: Married    Spouse Name: N/A  . Number of Children: N/A  . Years of Education: N/A   Occupational History  . Not on file.   Social History Main Topics  . Smoking status: Never Smoker   . Smokeless tobacco: Not on file  . Alcohol Use: 0.0 oz/week    0 Standard drinks or equivalent per week     Comment: rare  . Drug Use: No  . Sexual Activity:    Partners: Female   Other Topics Concern  . Not on file   Social History Narrative     PHYSICAL EXAM  Filed Vitals:   03/27/16 1609  BP: 160/110  Pulse: 76  Resp: 16  Height: 6' (1.829 m)  Weight: 219 lb (99.338 kg)    Body mass index is 29.7 kg/(m^2).   General: The patient is well-developed and well-nourished and in no acute distress  Neck: The neck is supple with good ROM.  The neck is nontender.  Skin: Extremities are without significant edema.  Musculoskeletal:  Back is nontender  Neurologic Exam  Mental status: The patient is alert and oriented x 3 at the time of the examination. The patient has apparent normal recent and remote memory, with an apparently normal attention span and concentration ability.   Speech is normal.  Cranial  nerves: Extraocular movements are full.   There is good facial sensation to soft touch bilaterally.Facial strength is normal.  Trapezius and sternocleidomastoid strength is normal. No dysarthria is noted.   No obvious hearing deficits are noted.  Motor:  Muscle bulk is mildly reduced in the FDIO and ADM muscles.   Tone is normal. Strength in right arm is:   5 / 5 deltoid, biceps, pronator teres, FDP (median); 4+/5 in triceps, APB, FDP (ulnar); 4-/5 in wrist extensors; 3 to 4-/5 in finger extensors and ulnar innervated hand muscles.     Sensory: Sensory testing is intact to temp, soft touch and vibration sensation in arms.  Coordination: Cerebellar testing reveals good finger-nose-finger bilaterally.  Gait and station: Station is normal.   Gait is normal.   Reflexes: Deep tendon reflexes are symmetric and normal bilaterally.       DIAGNOSTIC DATA (LABS, IMAGING, TESTING) - I reviewed patient  records, labs, notes, testing and imaging myself where available.      ASSESSMENT AND PLAN  Cervical disc disorder with radiculopathy of cervical region  Right arm weakness - Plan: MR Chest W Wo Contrast   In summary, Mr. Nicholaus BloomKelley is a 50 year old man who had the onset of right arm weakness that began in January and has slowly progressed the MRI shows significant foraminal narrowing at C5-C6, C6-C7 and C7-T1. The NCV/EMG showed acute or ongoing denervation predominantly in all normal and radial nerve innervated muscles of the C7 and C8 myotomes.   The differential diagnosis is between multiple (C7 and C8) cervical radiculopathies and brachial plexopathy. Given the findings on exam, EMG and arm MRI, focal multiple (radial and ulnar) neuropathies are much less likely.    Although pain would be more typical I believe that C7 and C8 radiculopathies is the most likely diagnosis.      His history and time course are not consistent with idiopathic brachial plexopathy as pain at the onset and upper trunk /  lateral cord distribution is more common.   Additionally, idiopathic brachial neuropathy should not continue to progress after the first month.     I cannot rule out that there could be a compressive brachial plexopathy.   I discussed with Mr. Nicholaus BloomKelley that I feel the likelihood that this is multiple cervical radiculopathies is about 80% and that this represents a brachial plexopathy about 20%.    The likelihood is high enough that we need to rule out a compressive or intrinsic brachial plexopathy and I have ordered an MRI of the brachial plexus with and without contrast. If this MRI study is normal, the likelihood of brachial plexopathy is even smaller.    Cervical surgery would then be recommended as he is progressing.    I let him know that I would forward a copy of this evaluation to Dr. Shon BatonBrooks and that any follow-up could be as needed. We will call him with the results of the MRI.  45 minute face to face evaluation with greater than one half of the time counseling and coordinating care about his progressive arm weakness.  Marchel Foote A. Epimenio FootSater, MD, PhD 03/27/2016, 4:21 PM Certified in Neurology, Clinical Neurophysiology, Sleep Medicine, Pain Medicine and Neuroimaging  [facility] [address], [address] [place], [place] [id number] [phone]

## 2016-03-28 NOTE — Progress Notes (Signed)
TODAY'S OV NOTE FAXED TO DR. Shon [facility]BROOKS AT [facility] FAX # [phone]/fim

## 2016-04-03 ENCOUNTER — Ambulatory Visit
Admission: RE | Admit: 2016-04-03 | Discharge: 2016-04-03 | Disposition: A | Discharge status: Home / Self Care | Payer: 59 | Source: Ambulatory Visit | Attending: Neurology | Admitting: Neurology

## 2016-04-03 DIAGNOSIS — R29898 Other symptoms and signs involving the musculoskeletal system: Secondary | ICD-10-CM

## 2016-04-03 MED ORDER — GADOBENATE DIMEGLUMINE 529 MG/ML IV SOLN
20.0000 mL | Freq: Once | INTRAVENOUS | Status: AC | PRN
  Administered 2016-04-03: 20 mL via INTRAVENOUS

## 2016-05-13 ENCOUNTER — Encounter (HOSPITAL_COMMUNITY): Payer: Self-pay | Admitting: Psychiatry

## 2016-05-13 ENCOUNTER — Ambulatory Visit (INDEPENDENT_AMBULATORY_CARE_PROVIDER_SITE_OTHER): Payer: 59 | Admitting: Psychiatry

## 2016-05-13 VITALS — BP 130/78 | HR 97 | Ht 72.0 in | Wt 216.8 lb

## 2016-05-13 DIAGNOSIS — R5383 Other fatigue: Secondary | ICD-10-CM

## 2016-05-13 DIAGNOSIS — F331 Major depressive disorder, recurrent, moderate: Secondary | ICD-10-CM | POA: Diagnosis not present

## 2016-05-13 DIAGNOSIS — G4700 Insomnia, unspecified: Secondary | ICD-10-CM

## 2016-05-13 MED ORDER — ESCITALOPRAM OXALATE 10 MG PO TABS: 10.0000 mg | ORAL_TABLET | Freq: Every day | ORAL | 0 refills | Status: DC

## 2016-05-13 NOTE — Progress Notes (Signed)
Patient ID: James Conley, male   DOB: [birth year], 50 y.o.   MRN: [id number] James Conley  James Conley [id number] 50 y.o.  05/13/2016 10:20 AM  Chief Complaint:  depression  History of Present Illness:   Patient Presents for follow up Conley for medication management and diagnosis of depression, insomnia and fatigue. Patient is currently married Caucasian. Relocated from  [place] to [place].  Depression: not worsened. Takes lexapro.  Patient has gone through some numbness and is having a pending surgery the back of the neck to relieve pressure on the nerves surgery is going to take this week  Relationship has arguments which makes it worse at times. Has changed job so less stress Insomnia: not worsened. Takes trazadone prn.will not need refill. James Conley makes him too sleepy tried before Severity of depression now 8/10. 10 being v happy.  Fatigue: at times not worsened.   Other triggers for depression are conflicts at home and in relationship which remains somewhat distant. No change there Modifying factor: work. But people are getting laid off.   Duration of depression more than around 16 years or more  years timing possibly related to father's death and relationship.     Past Psychiatric History/Hospitalization(s) denies  Hospitalization for psychiatric illness: No History of Electroconvulsive Shock Therapy: No Prior Suicide Attempts: No  Medical History; Past Medical History:  Diagnosis Date  . Anxiety   . Depression   . Headache(784.0)   . High cholesterol   . Shoulder injury     Allergies: No Known Allergies  Medications: Outpatient Encounter Prescriptions as of 05/13/2016  Medication Sig  . amLODipine (NORVASC) 2.5 MG tablet   . escitalopram (LEXAPRO) 10 MG tablet Take 1 tablet (10 mg total) by mouth daily.  . fenofibrate 160 MG tablet Take 160 mg by mouth daily.  . [DISCONTINUED] escitalopram (LEXAPRO) 10 MG  tablet Take 1 tablet (10 mg total) by mouth daily.  Marland Kitchen aspirin 81 MG tablet Take 81 mg by mouth daily.  . traZODone (DESYREL) 50 MG tablet Take 1 tablet (50 mg total) by mouth at bedtime as needed for sleep. (Patient not taking: Reported on 03/27/2016)   No facility-administered encounter medications on file as of 05/13/2016.      Family History; Family History  Problem Relation Age of Onset  . Anxiety disorder Mother   . Hypertension Maternal Grandmother   . Heart disease Maternal Grandfather   . Myasthenia gravis Maternal Grandfather      Labs:  No results found for this or any previous Conley (from the past 2160 hour(s)).   Musculoskeletal: Strength & Muscle Tone: within normal limits Gait & Station: normal Patient leans: N/A  Mental Status Examination;   Psychiatric Specialty Exam: Physical Exam  Constitutional: He appears well-developed and well-nourished.  HENT:  Head: Normocephalic and atraumatic.  Neck: Neck supple.    Review of Systems  Cardiovascular: Negative for chest pain and palpitations.  Gastrointestinal: Negative for nausea.  Skin: Negative for rash.  Neurological: Negative for tremors.  Psychiatric/Behavioral: Negative for depression and suicidal ideas.    Blood pressure 130/78, pulse 97, height 6' (1.829 m), weight 216 lb 12.8 oz (98.3 kg), SpO2 96 %.Body mass index is 29.4 kg/m.  General Appearance: Casual  Eye Contact::  Fair  Speech:  Normal Rate  Volume:  Decreased  Mood:  euthymic  Affect:  Congruent  Thought Process:  Linear  Orientation:  Full (Time, Place, and Person)  Thought Content:  Rumination  Suicidal Thoughts:  No  Homicidal Thoughts:  No  Memory:  Immediate;   Fair Recent;   Fair  Judgement:  Fair  Insight:  Fair  Psychomotor Activity:  Normal  Concentration:  Fair  Recall:  Fair  Akathisia:  Negative  Handed:  Right  AIMS (if indicated):     Assets:  Communication Skills Desire for Improvement Financial  Resources/Insurance Housing  Sleep:        Assessment: 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 Insomnia. Fatigue other  Axis II: Deferred  Axis III:  Past Medical History:  Diagnosis Date  . Anxiety   . Depression   . Headache(784.0)   . High cholesterol   . Shoulder injury     Axis IV: Psychosocial financial   Treatment Plan and Summary: Lexapro 10 mg prescription written for 90 days.  Take trazadone prn for sleep.  Refill not needed yet Fatigue not worsened.  Continue to work on sleep hygiene.  Discussed weight maintenance and Sleep Hygiene in regard to his insomnia and how to work on his difficulty sleeping Follow up with Primary care provider in regards to Medical conditions.  Job stress better since change expected. Greater than 50% of time was spend in counseling and coordination of care with the patient.  Going thru surgery this week. Schedule for Follow up Conley in 12   weeks  or call in earlier as necessary. Consider to involve wife in therapy if needed Time spent: 25 minutes  James Conley, James Keisling, MD 05/13/2016       Patient ID: James SacramentoBrian Conley, male   DOB: 19-Jan-1966, 50 y.o.   MRN: [id number]

## 2016-08-13 ENCOUNTER — Other Ambulatory Visit (HOSPITAL_COMMUNITY): Payer: Self-pay | Admitting: Psychiatry

## 2016-08-13 DIAGNOSIS — F331 Major depressive disorder, recurrent, moderate: Secondary | ICD-10-CM

## 2016-08-15 NOTE — Telephone Encounter (Signed)
Received fax from [facility] requesting a refill for Lexapro. Per Dr. Gilmore LarocheAkhtar, refill authorized for Lexapro 10mg , #90. Rx sent to pharmacy. Pt f/u apt is schedule for 11/16. Called and informed pt of refill status. Pt verbalizes understanding.

## 2016-08-18 ENCOUNTER — Ambulatory Visit (HOSPITAL_COMMUNITY): Payer: Self-pay | Admitting: Psychiatry

## 2016-08-21 ENCOUNTER — Ambulatory Visit (INDEPENDENT_AMBULATORY_CARE_PROVIDER_SITE_OTHER): Payer: 59 | Admitting: Psychiatry

## 2016-08-21 ENCOUNTER — Encounter (HOSPITAL_COMMUNITY): Payer: Self-pay | Admitting: Psychiatry

## 2016-08-21 VITALS — BP 122/70 | HR 98 | Resp 14 | Ht 72.0 in | Wt 223.0 lb

## 2016-08-21 DIAGNOSIS — F5102 Adjustment insomnia: Secondary | ICD-10-CM

## 2016-08-21 DIAGNOSIS — F331 Major depressive disorder, recurrent, moderate: Secondary | ICD-10-CM

## 2016-08-21 DIAGNOSIS — Z79899 Other long term (current) drug therapy: Secondary | ICD-10-CM

## 2016-08-21 DIAGNOSIS — R5383 Other fatigue: Secondary | ICD-10-CM | POA: Diagnosis not present

## 2016-08-21 DIAGNOSIS — Z8249 Family history of ischemic heart disease and other diseases of the circulatory system: Secondary | ICD-10-CM | POA: Diagnosis not present

## 2016-08-21 NOTE — Progress Notes (Signed)
Patient ID: James Conley, male   DOB: 1966/01/05, 50 y.o.   MRN: [id number] [facility] Follow up Outpatient visit  James Conley [id number] 49 y.o.  08/21/2016 10:49 AM  Chief Complaint:  depression  History of Present Illness:   Patient Presents for follow up visit for medication management and diagnosis of depression, insomnia and fatigue. Patient is currently married Caucasian. [place]  Depression: not worsened. Takes lexapro.  Patient has gone through some numbness and now back neck surgery which has helped. Stress improved  Relationship has arguments which makes it worse at times. Tries to avoid conflicts.  Overall content with treatment plan Insomnia: not worsened. Takes trazadone prn.will not need refill. Remus Lofflerambien makes him too sleepy tried before.  Severity of depression now 8/10. 10 being v happy.  Fatigue: at times not worsened.   Other triggers for depression are conflicts at home and in relationship which remains somewhat distant. No change there Modifying factor: work. But people are getting laid off.   Duration of depression more than around 16 years or more  years timing possibly related to father's death and relationship.     Past Psychiatric History/Hospitalization(s) denies  Hospitalization for psychiatric illness: No History of Electroconvulsive Shock Therapy: No Prior Suicide Attempts: No  Medical History; Past Medical History:  Diagnosis Date  . Anxiety   . Depression   . Headache(784.0)   . High cholesterol   . Shoulder injury     Allergies: No Known Allergies  Medications: Outpatient Encounter Prescriptions as of 08/21/2016  Medication Sig  . amLODipine (NORVASC) 2.5 MG tablet   . aspirin 81 MG tablet Take 81 mg by mouth daily.  Marland Kitchen. escitalopram (LEXAPRO) 10 MG tablet TAKE 1 TABLET BY MOUTH EVERY DAY  . fenofibrate 160 MG tablet Take 160 mg by mouth daily.  . traZODone (DESYREL) 50 MG  tablet Take 1 tablet (50 mg total) by mouth at bedtime as needed for sleep. (Patient not taking: Reported on 03/27/2016)   No facility-administered encounter medications on file as of 08/21/2016.      Family History; Family History  Problem Relation Age of Onset  . Heart disease Maternal Grandfather   . Myasthenia gravis Maternal Grandfather   . Anxiety disorder Mother   . Hypertension Maternal Grandmother      Labs:  No results found for this or any previous visit (from the past 2160 hour(s)).   Musculoskeletal: Strength & Muscle Tone: within normal limits Gait & Station: normal Patient leans: N/A  Mental Status Examination;   Psychiatric Specialty Exam: Physical Exam  Constitutional: He appears well-developed and well-nourished.  HENT:  Head: Normocephalic and atraumatic.  Neck: Neck supple.    Review of Systems  Cardiovascular: Negative for chest pain.  Gastrointestinal: Negative for nausea.  Skin: Negative for rash.  Neurological: Negative for tingling.  Psychiatric/Behavioral: Negative for depression and suicidal ideas.    Blood pressure 122/70, pulse 98, resp. rate 14, height 6' (1.829 m), weight 223 lb (101.2 kg), SpO2 97 %.Body mass index is 30.24 kg/m.  General Appearance: Casual  Eye Contact::  Fair  Speech:  Normal Rate  Volume:  Decreased  Mood:  euthymic  Affect:  Congruent  Thought Process:  Linear  Orientation:  Full (Time, Place, and Person)  Thought Content:  Rumination  Suicidal Thoughts:  No  Homicidal Thoughts:  No  Memory:  Immediate;   Fair Recent;   Fair  Judgement:  Fair  Insight:  Fair  Psychomotor Activity:  Normal  Concentration:  Fair  Recall:  Fair  Akathisia:  Negative  Handed:  Right  AIMS (if indicated):     Assets:  Communication Skills Desire for Improvement Financial Resources/Insurance Housing  Sleep:        Assessment: 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 Insomnia. Fatigue other  Axis II:  Deferred  Axis III:  Past Medical History:  Diagnosis Date  . Anxiety   . Depression   . Headache(784.0)   . High cholesterol   . Shoulder injury     Axis IV: Psychosocial financial   Treatment Plan and Summary: Lexapro 10 mg for depression and anxiety. Just refilled a week ago.   Take trazadone prn for sleep.  Refill not needed yet Fatigue not worsened.  Continue to work on sleep hygiene.  Discussed weight maintenance and Sleep Hygiene in regard to his insomnia and how to work on his difficulty sleeping Follow up with Primary care provider in regards to Medical conditions.  Job stress better since change expected. Greater than 50% of time was spend in counseling and coordination of care with the patient. Supportive therapy and coping skills.   Schedule for Follow up visit in 12   weeks  or call in earlier as necessary.  Time spent: 25 minutes  Thresa RossAKHTAR, Joniel Graumann, MD 08/21/2016       Patient ID: James Conley, male   DOB: 10/28/1965, [age] y.o.   MRN: [id number]

## 2016-11-11 ENCOUNTER — Other Ambulatory Visit (HOSPITAL_COMMUNITY): Payer: Self-pay | Admitting: Psychiatry

## 2016-11-11 DIAGNOSIS — F331 Major depressive disorder, recurrent, moderate: Secondary | ICD-10-CM

## 2016-11-13 ENCOUNTER — Ambulatory Visit (INDEPENDENT_AMBULATORY_CARE_PROVIDER_SITE_OTHER): Payer: 59 | Admitting: Psychiatry

## 2016-11-13 ENCOUNTER — Encounter (HOSPITAL_COMMUNITY): Payer: Self-pay | Admitting: Psychiatry

## 2016-11-13 VITALS — BP 128/78 | HR 86 | Resp 16 | Ht 72.0 in | Wt 222.0 lb

## 2016-11-13 DIAGNOSIS — F331 Major depressive disorder, recurrent, moderate: Secondary | ICD-10-CM

## 2016-11-13 DIAGNOSIS — Z79899 Other long term (current) drug therapy: Secondary | ICD-10-CM

## 2016-11-13 DIAGNOSIS — G4700 Insomnia, unspecified: Secondary | ICD-10-CM

## 2016-11-13 DIAGNOSIS — R5383 Other fatigue: Secondary | ICD-10-CM

## 2016-11-13 DIAGNOSIS — F5102 Adjustment insomnia: Secondary | ICD-10-CM | POA: Diagnosis not present

## 2016-11-13 DIAGNOSIS — Z8249 Family history of ischemic heart disease and other diseases of the circulatory system: Secondary | ICD-10-CM

## 2016-11-13 MED ORDER — ESCITALOPRAM OXALATE 10 MG PO TABS: 10.0000 mg | ORAL_TABLET | Freq: Every day | ORAL | 0 refills | Status: DC

## 2016-11-13 NOTE — Progress Notes (Signed)
Patient ID: James Conley, male   DOB: 1966/01/10, 51 y.o.   MRN: [id number] [facility] Follow up Outpatient visit  James Conley [id number] 51 y.o.  11/13/2016 10:18 AM  Chief Complaint:  depression  History of Present Illness:   Patient Presents for follow up visit for medication management and diagnosis of depression, insomnia and fatigue. Patient is currently married Caucasian. Relocated from  [place] to [place].  Patient is doing reasonable with medication depression has not worsened anxiety is manageable. Family conflicts or less. He likes his work. Not too many arguments at home. Insomnia is also not worsened  At times feels fatigue but it has not worsened     Medical History; Past Medical History:  Diagnosis Date  . Anxiety   . Depression   . Headache(784.0)   . High cholesterol   . Shoulder injury     Allergies: No Known Allergies  Medications: Outpatient Encounter Prescriptions as of 11/13/2016  Medication Sig  . amLODipine (NORVASC) 2.5 MG tablet   . aspirin 81 MG tablet Take 81 mg by mouth daily.  James Conley escitalopram (LEXAPRO) 10 MG tablet Take 1 tablet (10 mg total) by mouth daily.  . fenofibrate 160 MG tablet Take 160 mg by mouth daily.  . traZODone (DESYREL) 50 MG tablet Take 1 tablet (50 mg total) by mouth at bedtime as needed for sleep.  . [DISCONTINUED] escitalopram (LEXAPRO) 10 MG tablet TAKE 1 TABLET BY MOUTH EVERY DAY   No facility-administered encounter medications on file as of 11/13/2016.      Family History; Family History  Problem Relation Age of Onset  . Heart disease Maternal Grandfather   . Myasthenia gravis Maternal Grandfather   . Anxiety disorder Mother   . Hypertension Maternal Grandmother      Labs:  No results found for this or any previous visit (from the past 2160 hour(s)).    Mental Status Examination;   Psychiatric Specialty Exam: Physical Exam  Constitutional: He appears well-developed and  well-nourished.  HENT:  Head: Normocephalic and atraumatic.  Neck: Neck supple.    Review of Systems  Cardiovascular: Negative for palpitations.  Gastrointestinal: Negative for nausea.  Skin: Negative for rash.  Neurological: Negative for tingling.  Psychiatric/Behavioral: Negative for depression and suicidal ideas.    Blood pressure 128/78, pulse 86, resp. rate 16, height 6' (1.829 m), weight 222 lb (100.7 kg), SpO2 95 %.Body mass index is 30.11 kg/m.  General Appearance: Casual  Eye Contact::  Fair  Speech:  Normal Rate  Volume:  Decreased  Mood: euthymic  Affect:  Congruent  Thought Process:  Linear  Orientation:  Full (Time, Place, and Person)  Thought Content:  Rumination  Suicidal Thoughts:  No  Homicidal Thoughts:  No  Memory:  Immediate;   Fair Recent;   Fair  Judgement:  Fair  Insight:  Fair  Psychomotor Activity:  Normal  Concentration:  Fair  Recall:  Fair  Akathisia:  Negative  Handed:  Right  AIMS (if indicated):     Assets:  Communication Skills Desire for Improvement Financial Resources/Insurance Housing  Sleep:        Assessment: 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 Insomnia. Fatigue other  Axis II: Deferred  Axis III:  Past Medical History:  Diagnosis Date  . Anxiety   . Depression   . Headache(784.0)   . High cholesterol   . Shoulder injury     Axis IV: Psychosocial financial   Treatment Plan and Summary:  1. MDD:  mild. Continue lexapro 2. GAd: not worsened: continue lexapro 3. Insomnia: improved. Seldom takes trazadone  Patient wants to consider. Medication talked about a plan starting April 1 T-scores start taking half of Lexapro and after 1 month use can stop it I will see him back at the month of May and review or continue to hold off her medication he is advised to talk with his wife so she is aware that he is reliving the lower dose and indicates any symptoms are worsening to please call us earlier   James Conley,  James Medinger, MD 11/13/2016       Patient ID: James Conley, male   DOB: 02-Apr-1966, 51 y.o.   MRN: [id number]

## 2017-02-03 ENCOUNTER — Encounter (HOSPITAL_COMMUNITY): Payer: Self-pay | Admitting: Psychiatry

## 2017-02-03 ENCOUNTER — Ambulatory Visit (INDEPENDENT_AMBULATORY_CARE_PROVIDER_SITE_OTHER): Payer: 59 | Admitting: Psychiatry

## 2017-02-03 DIAGNOSIS — F419 Anxiety disorder, unspecified: Secondary | ICD-10-CM

## 2017-02-03 DIAGNOSIS — Z818 Family history of other mental and behavioral disorders: Secondary | ICD-10-CM

## 2017-02-03 DIAGNOSIS — G4700 Insomnia, unspecified: Secondary | ICD-10-CM | POA: Diagnosis not present

## 2017-02-03 DIAGNOSIS — Z79899 Other long term (current) drug therapy: Secondary | ICD-10-CM

## 2017-02-03 DIAGNOSIS — R5383 Other fatigue: Secondary | ICD-10-CM | POA: Diagnosis not present

## 2017-02-03 DIAGNOSIS — F331 Major depressive disorder, recurrent, moderate: Secondary | ICD-10-CM

## 2017-02-03 DIAGNOSIS — F5102 Adjustment insomnia: Secondary | ICD-10-CM

## 2017-02-03 DIAGNOSIS — Z7982 Long term (current) use of aspirin: Secondary | ICD-10-CM

## 2017-02-03 NOTE — Progress Notes (Signed)
Patient ID: James Conley, male   DOB: 25-Mar-1966, 51 y.o.   MRN: [id number] [facility] Follow up Outpatient visit  Bliss Tsang [id number] 50 y.o.  02/03/2017 10:24 AM  Chief Complaint:  depression  History of Present Illness:   Patient Presents for follow up visit for medication management and diagnosis of depression, insomnia and fatigue. Patient is currently married Caucasian. Relocated from  [place] to [place].  Patient's cut down the Lexapro and now taken off for the last 4 weeks he is also not taking trazodone regularly he did handle the withdrawal reasonable to some hopeless hopelessness there are some down days he does get upset when he thinks about his daughter who is 37 years of age and has gone through some difficult time during one sleepover with a friend while being stopped cutting she got into a private chat rooms and that was traumatizing.  Anxiety ; baseline  Insomnia; fluctuates At times feels fatigue but it has not worsened     Medical History; Past Medical History:  Diagnosis Date  . Anxiety   . Depression   . Headache(784.0)   . High cholesterol   . Shoulder injury     Allergies: No Known Allergies  Medications: Outpatient Encounter Prescriptions as of 02/03/2017  Medication Sig  . amLODipine (NORVASC) 2.5 MG tablet   . aspirin 81 MG tablet Take 81 mg by mouth daily.  . fenofibrate 160 MG tablet Take 160 mg by mouth daily.  . traZODone (DESYREL) 50 MG tablet Take 1 tablet (50 mg total) by mouth at bedtime as needed for sleep.  . [DISCONTINUED] escitalopram (LEXAPRO) 10 MG tablet Take 1 tablet (10 mg total) by mouth daily.   No facility-administered encounter medications on file as of 02/03/2017.      Family History; Family History  Problem Relation Age of Onset  . Heart disease Maternal Grandfather   . Myasthenia gravis Maternal Grandfather   . Anxiety disorder Mother   . Hypertension Maternal Grandmother       Labs:  No results found for this or any previous visit (from the past 2160 hour(s)).    Mental Status Examination;   Psychiatric Specialty Exam: Physical Exam  Constitutional: He appears well-developed and well-nourished.  HENT:  Head: Normocephalic and atraumatic.  Neck: Neck supple.    Review of Systems  Cardiovascular: Negative for chest pain.  Gastrointestinal: Negative for nausea.  Skin: Negative for rash.  Neurological: Negative for tingling.  Psychiatric/Behavioral: Negative for suicidal ideas.    There were no vitals taken for this visit.There is no height or weight on file to calculate BMI.  General Appearance: Casual  Eye Contact::  Fair  Speech:  Normal Rate  Volume:  Decreased  Mood:  fair  Affect:  congruent  Thought Process:  Linear  Orientation:  Full (Time, Place, and Person)  Thought Content:  Rumination  Suicidal Thoughts:  No  Homicidal Thoughts:  No  Memory:  Immediate;   Fair Recent;   Fair  Judgement:  Fair  Insight:  Fair  Psychomotor Activity:  Normal  Concentration:  Fair  Recall:  Fair  Akathisia:  Negative  Handed:  Right  AIMS (if indicated):     Assets:  Communication Skills Desire for Improvement Financial Resources/Insurance Housing  Sleep:        Assessment: 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 Insomnia. Fatigue other  Axis II: Deferred  Axis III:  Past Medical History:  Diagnosis Date  . Anxiety   .  Depression   . Headache(784.0)   . High cholesterol   . Shoulder injury     Axis IV: Psychosocial financial   Treatment Plan and Summary:  1. MDD: mild. Not worsened. Not on lexapro for a month. Wants to not start it 2. GAd: basline. Worries about relationship  3. Insomnia: fluctuates, reviewed sleep hygiene. Takes trazaone prn  We will hold off from Lexapro follow-up in 2 months also advised and patient was to schedule an appointment for his daughter with a counselor provided supportive  therapy. Reviewed sleep hygiene  Thresa Ross, MD 02/03/2017

## 2017-02-11 ENCOUNTER — Other Ambulatory Visit (HOSPITAL_COMMUNITY): Payer: Self-pay | Admitting: Psychiatry

## 2017-02-11 DIAGNOSIS — F331 Major depressive disorder, recurrent, moderate: Secondary | ICD-10-CM

## 2017-02-13 NOTE — Telephone Encounter (Signed)
Received fax from [facility] requesting a refill for Lexapro. Per Dr. Gilmore LarocheAkhtar, refill request is denied. Medication was d/c on 02/03/17. Pharmacy notified.  Nothing further is need at this time.

## 2017-03-04 ENCOUNTER — Ambulatory Visit: Payer: Managed Care, Other (non HMO) | Admitting: Nurse Practitioner

## 2017-03-07 ENCOUNTER — Other Ambulatory Visit (HOSPITAL_COMMUNITY): Payer: Self-pay | Admitting: Psychiatry

## 2017-03-07 DIAGNOSIS — F331 Major depressive disorder, recurrent, moderate: Secondary | ICD-10-CM

## 2017-03-16 NOTE — Telephone Encounter (Signed)
Received fax from [facility] requesting a refill for Lexapro. Per Dr. Gilmore LarocheAkhtar, refill request is denied. Medication was d/c on 02/03/17. Pharmacy notified.  Nothing further is need at this time.

## 2017-04-02 ENCOUNTER — Ambulatory Visit (HOSPITAL_COMMUNITY): Payer: Self-pay | Admitting: Psychiatry

## 2017-04-30 ENCOUNTER — Ambulatory Visit (INDEPENDENT_AMBULATORY_CARE_PROVIDER_SITE_OTHER): Payer: 59 | Admitting: Psychiatry

## 2017-04-30 ENCOUNTER — Encounter (HOSPITAL_COMMUNITY): Payer: Self-pay | Admitting: Psychiatry

## 2017-04-30 VITALS — BP 120/80 | HR 89 | Resp 16 | Ht 72.0 in | Wt 213.0 lb

## 2017-04-30 DIAGNOSIS — F5102 Adjustment insomnia: Secondary | ICD-10-CM | POA: Diagnosis not present

## 2017-04-30 DIAGNOSIS — Z818 Family history of other mental and behavioral disorders: Secondary | ICD-10-CM

## 2017-04-30 DIAGNOSIS — F331 Major depressive disorder, recurrent, moderate: Secondary | ICD-10-CM | POA: Diagnosis not present

## 2017-04-30 DIAGNOSIS — F419 Anxiety disorder, unspecified: Secondary | ICD-10-CM

## 2017-04-30 DIAGNOSIS — R5382 Chronic fatigue, unspecified: Secondary | ICD-10-CM | POA: Diagnosis not present

## 2017-04-30 NOTE — Progress Notes (Signed)
Patient ID: James Conley Caldwell, male   DOB: 12-24-1965, 51 y.o.   MRN: [id number] [facility] Follow up Outpatient visit  James Conley Magno [id number] 50 y.o.  04/30/2017 10:10 AM  Chief Complaint:  depression  History of Present Illness:   Patient Presents for follow up visit for medication management and diagnosis of depression, insomnia and fatigue. Patient is currently married Caucasian. [place].  Patient is doing stable without medication is not taking trazodone other sleep remains somewhat disturbing but overall depression anxiety has not reoccurred he has stopped Lexapro now more than 2 months.   Anxiety ; baseline. Not worse  Insomnia; fluctuates At times feels fatigue but it has not worsened     Medical History; Past Medical History:  Diagnosis Date  . Anxiety   . Depression   . Headache(784.0)   . High cholesterol   . Shoulder injury     Allergies: No Known Allergies  Medications: Outpatient Encounter Prescriptions as of 04/30/2017  Medication Sig  . amLODipine (NORVASC) 2.5 MG tablet   . aspirin 81 MG tablet Take 81 mg by mouth daily.  . fenofibrate 160 MG tablet Take 160 mg by mouth daily.  . [DISCONTINUED] traZODone (DESYREL) 50 MG tablet Take 1 tablet (50 mg total) by mouth at bedtime as needed for sleep.   No facility-administered encounter medications on file as of 04/30/2017.      Family History; Family History  Problem Relation Age of Onset  . Heart disease Maternal Grandfather   . Myasthenia gravis Maternal Grandfather   . Anxiety disorder Mother   . Hypertension Maternal Grandmother      Labs:  No results found for this or any previous visit (from the past 2160 hour(s)).    Mental Status Examination;   Psychiatric Specialty Exam: Physical Exam  Constitutional: He appears well-developed and well-nourished.  HENT:  Head: Normocephalic and atraumatic.  Neck: Neck supple.    Review of  Systems  Cardiovascular: Negative for chest pain.  Gastrointestinal: Negative for nausea.  Skin: Negative for rash.  Neurological: Negative for tingling.  Psychiatric/Behavioral: Negative for depression and suicidal ideas.    Blood pressure 120/80, pulse 89, resp. rate 16, height 6' (1.829 m), weight 213 lb (96.6 kg), SpO2 96 %.Body mass index is 28.89 kg/m.  General Appearance: Casual  Eye Contact::  Fair  Speech:  Normal Rate  Volume:  Decreased  Mood:  euthymic  Affect:  Congruent and pleasant  Thought Process:  Linear  Orientation:  Full (Time, Place, and Person)  Thought Content:  Rumination  Suicidal Thoughts:  No  Homicidal Thoughts:  No  Memory:  Immediate;   Fair Recent;   Fair  Judgement:  Fair  Insight:  Fair  Psychomotor Activity:  Normal  Concentration:  Fair  Recall:  Fair  Akathisia:  Negative  Handed:  Right  AIMS (if indicated):     Assets:  Communication Skills Desire for Improvement Financial Resources/Insurance Housing  Sleep:        Assessment: 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 Insomnia. Fatigue other  Axis II: Deferred  Axis III:  Past Medical History:  Diagnosis Date  . Anxiety   . Depression   . Headache(784.0)   . High cholesterol   . Shoulder injury     Axis IV: Psychosocial financial   Treatment Plan and Summary:  1. MDD: stable  2. GAd: baseline. Not worse 3. Insomnia: fluctuates. Reviewed sleep hygiene No meds today. lexapro has  been stopped.  FU 4-6 months . Call for concerns if needed  Thresa RossAKHTAR, Starlena Beil, MD 04/30/2017

## 2017-10-27 ENCOUNTER — Ambulatory Visit (HOSPITAL_COMMUNITY): Payer: 59 | Admitting: Psychiatry

## 2018-04-11 IMAGING — MR MR CHEST MEDIASTINUM WO/W CM
9 series · 16 of 16 positions shown · IV contrast (multihance)
Comparison: None.

CLINICAL DATA: Loss of strain in the right arm for 5 months.

EXAM:
MRI BRACHIAL PLEXUS WITHOUT AND WITH CONTRAST
TECHNIQUE: Multiplanar, multiecho pulse sequences of the neck and surrounding
structures were obtained without and with intravenous contrast. The
field of view was focused on the right brachial plexus from the
neural foramina to the axilla.
CONTRAST:  20mL MULTIHANCE GADOBENATE DIMEGLUMINE 529 MG/ML IV SOLN

[Series 6: T2 fat-sat · axial · 5.0mm · 1.64mm/px · z∈[-46,+110]mm · 3 of 27 slices shown (1 of 2)]
[im 1/27]
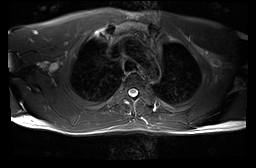
[im 14/27]
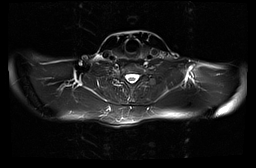
[im 27/27]
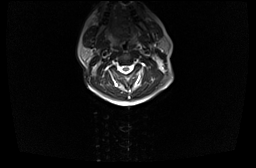

[Series 7: T1 · axial · 5.0mm · 1.31mm/px · z∈[-46,+110]mm · 2 of 25 slices shown (1 of 3)]
[im 1/25]
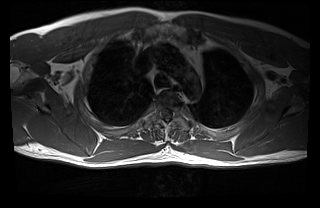
[im 25/25]
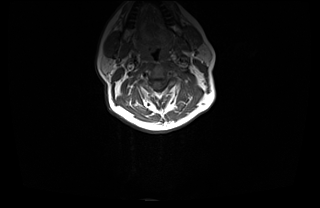

[Series 8: T1 · coronal · 5.0mm · 0.81mm/px · 1 of 21 slices shown (2 of 3)]
[im 1/21]
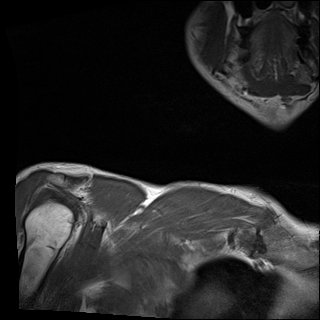

[Series 9: STIR · coronal · 5.0mm · 0.81mm/px · 1 of 21 slices shown]
[im 1/21]
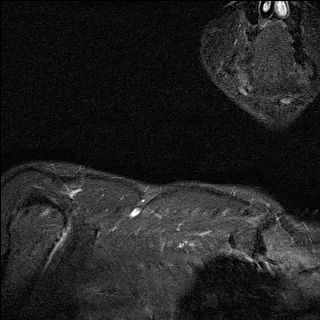

[Series 10: T1 · sagittal · 5.0mm · 0.81mm/px · 2 of 34 slices shown (3 of 3)]
[im 1/34]
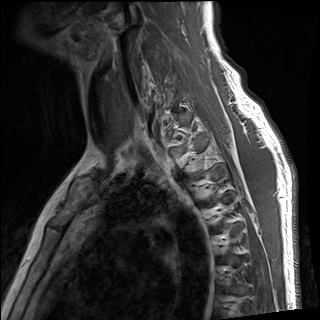
[im 34/34]
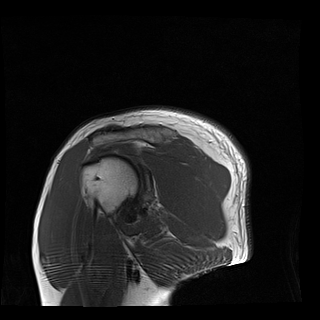

[Series 11: T2 fat-sat · sagittal · 5.0mm · 1.02mm/px · 2 of 34 slices shown (2 of 2)]
[im 1/34]
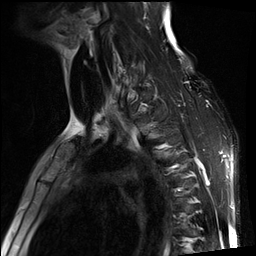
[im 34/34]
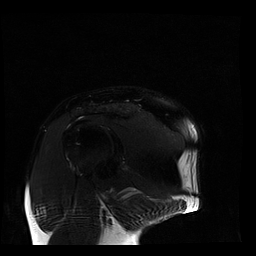

[Series 12: T1 fat-sat post-contrast · axial · 5.0mm · 1.31mm/px · z∈[-46,+110]mm · 2 of 27 slices shown]
[im 1/27]
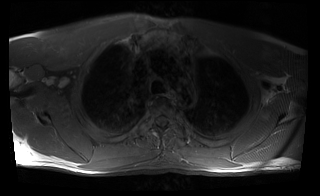
[im 27/27]
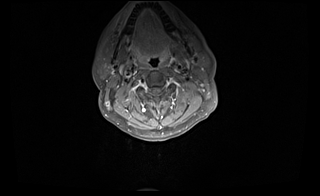

[Series 13: T1 fat-sat · coronal · 5.0mm · 0.81mm/px · 1 of 21 slices shown (1 of 2)]
[im 1/21]
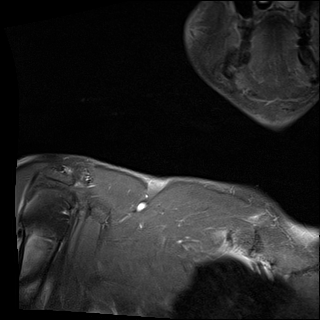

[Series 15: T1 fat-sat · sagittal · 5.0mm · 0.81mm/px · 2 of 34 slices shown (2 of 2)]
[im 1/34]
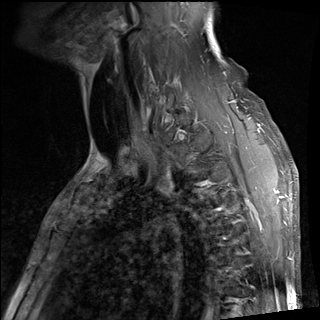
[im 34/34]
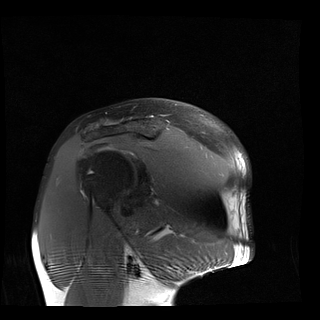

[16 of 16 positions shown; findings below may reference images not displayed]

FINDINGS: Mild degenerative cervical spondylosis with disc disease and facet
disease but no significant canal compromise. The cervical spinal
cord demonstrates normal signal intensity. No cord lesion or syrinx.

The brachial plexus structures appear normal. No inflammatory
process, abnormal enhancement, compressive lesion or mass. The
structures are surrounded by in the fat along their entire course.
No cervical ribs are identified.

The musculature appears normal. Normal signal intensity. No bone
lesions.
IMPRESSION: Unremarkable brachia plexus study.
